# Patient Record
Sex: Male | Born: 1955 | Race: White | Hispanic: No | Smoking: Current every day smoker
Health system: Southern US, Community
[De-identification: ages and names within clinical notes are randomized; demographics above are authoritative.]

## PROBLEM LIST (undated history)

## (undated) ENCOUNTER — Emergency Department (HOSPITAL_COMMUNITY): Admission: EM | Payer: Medicaid Other | Source: Home / Self Care

## (undated) DIAGNOSIS — M549 Dorsalgia, unspecified: Secondary | ICD-10-CM

## (undated) DIAGNOSIS — R911 Solitary pulmonary nodule: Secondary | ICD-10-CM

## (undated) DIAGNOSIS — J449 Chronic obstructive pulmonary disease, unspecified: Secondary | ICD-10-CM

## (undated) DIAGNOSIS — G8929 Other chronic pain: Secondary | ICD-10-CM

## (undated) HISTORY — PX: APPENDECTOMY: SHX54

## (undated) HISTORY — PX: CERVICAL SPINE SURGERY: SHX589

---

## 1998-12-22 ENCOUNTER — Emergency Department (HOSPITAL_COMMUNITY): Admission: EM | Admit: 1998-12-22 | Discharge: 1998-12-22 | Payer: Self-pay | Admitting: Emergency Medicine

## 1999-03-05 ENCOUNTER — Ambulatory Visit (HOSPITAL_COMMUNITY): Admission: RE | Admit: 1999-03-05 | Discharge: 1999-03-05 | Payer: Self-pay | Admitting: *Deleted

## 1999-05-07 ENCOUNTER — Encounter: Payer: Self-pay | Admitting: Specialist

## 1999-05-07 ENCOUNTER — Ambulatory Visit (HOSPITAL_COMMUNITY): Admission: RE | Admit: 1999-05-07 | Discharge: 1999-05-07 | Payer: Self-pay | Admitting: Specialist

## 2000-01-27 ENCOUNTER — Emergency Department (HOSPITAL_COMMUNITY): Admission: EM | Admit: 2000-01-27 | Discharge: 2000-01-27 | Payer: Self-pay | Admitting: Emergency Medicine

## 2000-01-27 ENCOUNTER — Encounter: Payer: Self-pay | Admitting: Emergency Medicine

## 2000-12-24 ENCOUNTER — Emergency Department (HOSPITAL_COMMUNITY): Admission: EM | Admit: 2000-12-24 | Discharge: 2000-12-24 | Payer: Self-pay | Admitting: *Deleted

## 2001-02-26 ENCOUNTER — Emergency Department (HOSPITAL_COMMUNITY): Admission: EM | Admit: 2001-02-26 | Discharge: 2001-02-27 | Payer: Self-pay | Admitting: Emergency Medicine

## 2001-05-22 ENCOUNTER — Encounter: Payer: Self-pay | Admitting: Internal Medicine

## 2001-05-22 ENCOUNTER — Ambulatory Visit (HOSPITAL_COMMUNITY): Admission: RE | Admit: 2001-05-22 | Discharge: 2001-05-22 | Payer: Self-pay | Admitting: Internal Medicine

## 2001-05-23 ENCOUNTER — Emergency Department (HOSPITAL_COMMUNITY): Admission: EM | Admit: 2001-05-23 | Discharge: 2001-05-23 | Payer: Self-pay | Admitting: Emergency Medicine

## 2001-06-01 ENCOUNTER — Encounter: Payer: Self-pay | Admitting: Internal Medicine

## 2001-06-01 ENCOUNTER — Ambulatory Visit (HOSPITAL_COMMUNITY): Admission: RE | Admit: 2001-06-01 | Discharge: 2001-06-01 | Payer: Self-pay | Admitting: Internal Medicine

## 2001-09-03 ENCOUNTER — Encounter: Payer: Self-pay | Admitting: Neurosurgery

## 2001-09-05 ENCOUNTER — Ambulatory Visit (HOSPITAL_COMMUNITY): Admission: RE | Admit: 2001-09-05 | Discharge: 2001-09-06 | Payer: Self-pay | Admitting: Neurosurgery

## 2001-09-05 ENCOUNTER — Encounter: Payer: Self-pay | Admitting: Neurosurgery

## 2001-09-07 ENCOUNTER — Emergency Department (HOSPITAL_COMMUNITY): Admission: EM | Admit: 2001-09-07 | Discharge: 2001-09-07 | Payer: Self-pay | Admitting: Emergency Medicine

## 2001-09-07 ENCOUNTER — Encounter: Payer: Self-pay | Admitting: Emergency Medicine

## 2002-12-09 ENCOUNTER — Emergency Department (HOSPITAL_COMMUNITY): Admission: EM | Admit: 2002-12-09 | Discharge: 2002-12-09 | Payer: Self-pay | Admitting: Emergency Medicine

## 2002-12-10 ENCOUNTER — Encounter: Payer: Self-pay | Admitting: Emergency Medicine

## 2003-05-27 ENCOUNTER — Emergency Department (HOSPITAL_COMMUNITY): Admission: EM | Admit: 2003-05-27 | Discharge: 2003-05-27 | Payer: Self-pay | Admitting: Emergency Medicine

## 2003-06-03 ENCOUNTER — Emergency Department (HOSPITAL_COMMUNITY): Admission: EM | Admit: 2003-06-03 | Discharge: 2003-06-03 | Payer: Self-pay | Admitting: Emergency Medicine

## 2003-06-09 ENCOUNTER — Emergency Department (HOSPITAL_COMMUNITY): Admission: EM | Admit: 2003-06-09 | Discharge: 2003-06-10 | Payer: Self-pay

## 2003-06-19 ENCOUNTER — Emergency Department (HOSPITAL_COMMUNITY): Admission: EM | Admit: 2003-06-19 | Discharge: 2003-06-19 | Payer: Self-pay | Admitting: Emergency Medicine

## 2003-06-27 ENCOUNTER — Emergency Department (HOSPITAL_COMMUNITY): Admission: AD | Admit: 2003-06-27 | Discharge: 2003-06-27 | Payer: Self-pay | Admitting: Family Medicine

## 2003-06-30 ENCOUNTER — Encounter: Admission: RE | Admit: 2003-06-30 | Discharge: 2003-06-30 | Payer: Self-pay | Admitting: Internal Medicine

## 2003-07-03 ENCOUNTER — Encounter: Admission: RE | Admit: 2003-07-03 | Discharge: 2003-07-03 | Payer: Self-pay | Admitting: Internal Medicine

## 2003-07-29 ENCOUNTER — Encounter: Admission: RE | Admit: 2003-07-29 | Discharge: 2003-07-29 | Payer: Self-pay | Admitting: Internal Medicine

## 2003-07-31 ENCOUNTER — Encounter: Admission: RE | Admit: 2003-07-31 | Discharge: 2003-07-31 | Payer: Self-pay | Admitting: Internal Medicine

## 2003-08-05 ENCOUNTER — Encounter: Admission: RE | Admit: 2003-08-05 | Discharge: 2003-08-05 | Payer: Self-pay | Admitting: Internal Medicine

## 2003-08-05 ENCOUNTER — Ambulatory Visit (HOSPITAL_COMMUNITY): Admission: RE | Admit: 2003-08-05 | Discharge: 2003-08-05 | Payer: Self-pay | Admitting: Internal Medicine

## 2003-08-11 ENCOUNTER — Encounter: Admission: RE | Admit: 2003-08-11 | Discharge: 2003-08-11 | Payer: Self-pay | Admitting: Internal Medicine

## 2003-08-21 ENCOUNTER — Inpatient Hospital Stay (HOSPITAL_COMMUNITY): Admission: AD | Admit: 2003-08-21 | Discharge: 2003-08-28 | Payer: Self-pay | Admitting: Psychiatry

## 2003-09-07 ENCOUNTER — Emergency Department (HOSPITAL_COMMUNITY): Admission: EM | Admit: 2003-09-07 | Discharge: 2003-09-07 | Payer: Self-pay | Admitting: Emergency Medicine

## 2003-09-09 ENCOUNTER — Emergency Department (HOSPITAL_COMMUNITY): Admission: EM | Admit: 2003-09-09 | Discharge: 2003-09-09 | Payer: Self-pay | Admitting: Emergency Medicine

## 2003-09-25 ENCOUNTER — Encounter: Admission: RE | Admit: 2003-09-25 | Discharge: 2003-09-25 | Payer: Self-pay | Admitting: Internal Medicine

## 2003-11-20 ENCOUNTER — Emergency Department (HOSPITAL_COMMUNITY): Admission: EM | Admit: 2003-11-20 | Discharge: 2003-11-20 | Payer: Self-pay | Admitting: Emergency Medicine

## 2003-12-09 ENCOUNTER — Emergency Department (HOSPITAL_COMMUNITY): Admission: EM | Admit: 2003-12-09 | Discharge: 2003-12-09 | Payer: Self-pay | Admitting: Emergency Medicine

## 2003-12-10 ENCOUNTER — Encounter: Admission: RE | Admit: 2003-12-10 | Discharge: 2003-12-10 | Payer: Self-pay | Admitting: Internal Medicine

## 2003-12-11 ENCOUNTER — Emergency Department (HOSPITAL_COMMUNITY): Admission: EM | Admit: 2003-12-11 | Discharge: 2003-12-11 | Payer: Self-pay | Admitting: Emergency Medicine

## 2003-12-24 ENCOUNTER — Encounter: Admission: RE | Admit: 2003-12-24 | Discharge: 2003-12-24 | Payer: Self-pay | Admitting: Internal Medicine

## 2004-01-04 ENCOUNTER — Emergency Department (HOSPITAL_COMMUNITY): Admission: EM | Admit: 2004-01-04 | Discharge: 2004-01-04 | Payer: Self-pay | Admitting: Emergency Medicine

## 2004-01-28 ENCOUNTER — Emergency Department (HOSPITAL_COMMUNITY): Admission: EM | Admit: 2004-01-28 | Discharge: 2004-01-28 | Payer: Self-pay | Admitting: Emergency Medicine

## 2004-02-11 ENCOUNTER — Ambulatory Visit: Payer: Self-pay | Admitting: Internal Medicine

## 2004-03-15 ENCOUNTER — Emergency Department (HOSPITAL_COMMUNITY): Admission: EM | Admit: 2004-03-15 | Discharge: 2004-03-15 | Payer: Self-pay | Admitting: Emergency Medicine

## 2004-04-03 ENCOUNTER — Emergency Department (HOSPITAL_COMMUNITY): Admission: EM | Admit: 2004-04-03 | Discharge: 2004-04-03 | Payer: Self-pay | Admitting: *Deleted

## 2004-06-14 ENCOUNTER — Emergency Department (HOSPITAL_COMMUNITY): Admission: EM | Admit: 2004-06-14 | Discharge: 2004-06-14 | Payer: Self-pay | Admitting: Family Medicine

## 2004-06-20 ENCOUNTER — Emergency Department (HOSPITAL_COMMUNITY): Admission: EM | Admit: 2004-06-20 | Discharge: 2004-06-20 | Payer: Self-pay | Admitting: Emergency Medicine

## 2004-08-05 ENCOUNTER — Ambulatory Visit: Payer: Self-pay | Admitting: Family Medicine

## 2004-08-19 ENCOUNTER — Ambulatory Visit: Payer: Self-pay | Admitting: Family Medicine

## 2004-09-19 ENCOUNTER — Emergency Department (HOSPITAL_COMMUNITY): Admission: EM | Admit: 2004-09-19 | Discharge: 2004-09-19 | Payer: Self-pay | Admitting: Emergency Medicine

## 2004-10-09 ENCOUNTER — Emergency Department (HOSPITAL_COMMUNITY): Admission: EM | Admit: 2004-10-09 | Discharge: 2004-10-09 | Payer: Self-pay | Admitting: Emergency Medicine

## 2004-10-23 ENCOUNTER — Emergency Department (HOSPITAL_COMMUNITY): Admission: EM | Admit: 2004-10-23 | Discharge: 2004-10-23 | Payer: Self-pay | Admitting: Emergency Medicine

## 2005-04-11 ENCOUNTER — Ambulatory Visit: Payer: Self-pay | Admitting: Family Medicine

## 2005-06-28 ENCOUNTER — Ambulatory Visit: Payer: Self-pay | Admitting: Family Medicine

## 2005-11-10 ENCOUNTER — Encounter: Admission: RE | Admit: 2005-11-10 | Discharge: 2005-11-10 | Payer: Self-pay | Admitting: Sports Medicine

## 2005-11-30 ENCOUNTER — Encounter: Admission: RE | Admit: 2005-11-30 | Discharge: 2005-11-30 | Payer: Self-pay | Admitting: Sports Medicine

## 2006-01-26 ENCOUNTER — Encounter: Admission: RE | Admit: 2006-01-26 | Discharge: 2006-01-26 | Payer: Self-pay | Admitting: Sports Medicine

## 2006-04-19 ENCOUNTER — Ambulatory Visit: Payer: Self-pay | Admitting: Pain Medicine

## 2006-06-09 ENCOUNTER — Encounter: Payer: Self-pay | Admitting: Family Medicine

## 2006-06-09 ENCOUNTER — Ambulatory Visit: Payer: Self-pay | Admitting: Family Medicine

## 2006-06-27 ENCOUNTER — Ambulatory Visit: Payer: Self-pay | Admitting: Family Medicine

## 2006-08-03 DIAGNOSIS — M545 Low back pain: Secondary | ICD-10-CM

## 2006-08-03 DIAGNOSIS — F17210 Nicotine dependence, cigarettes, uncomplicated: Secondary | ICD-10-CM | POA: Insufficient documentation

## 2006-08-03 DIAGNOSIS — F411 Generalized anxiety disorder: Secondary | ICD-10-CM | POA: Insufficient documentation

## 2006-08-03 DIAGNOSIS — J449 Chronic obstructive pulmonary disease, unspecified: Secondary | ICD-10-CM

## 2006-09-13 ENCOUNTER — Telehealth: Payer: Self-pay | Admitting: *Deleted

## 2006-09-18 ENCOUNTER — Telehealth: Payer: Self-pay | Admitting: *Deleted

## 2006-09-18 ENCOUNTER — Ambulatory Visit: Payer: Self-pay | Admitting: Sports Medicine

## 2006-09-18 DIAGNOSIS — R350 Frequency of micturition: Secondary | ICD-10-CM

## 2006-09-25 ENCOUNTER — Encounter: Payer: Self-pay | Admitting: Family Medicine

## 2006-09-27 ENCOUNTER — Encounter: Payer: Self-pay | Admitting: Family Medicine

## 2006-09-28 ENCOUNTER — Encounter: Payer: Self-pay | Admitting: Family Medicine

## 2006-10-01 ENCOUNTER — Emergency Department (HOSPITAL_COMMUNITY): Admission: EM | Admit: 2006-10-01 | Discharge: 2006-10-01 | Payer: Self-pay | Admitting: Emergency Medicine

## 2006-10-09 ENCOUNTER — Ambulatory Visit: Payer: Self-pay | Admitting: Sports Medicine

## 2006-10-23 ENCOUNTER — Telehealth: Payer: Self-pay | Admitting: *Deleted

## 2006-10-30 ENCOUNTER — Emergency Department (HOSPITAL_COMMUNITY): Admission: EM | Admit: 2006-10-30 | Discharge: 2006-10-30 | Payer: Self-pay | Admitting: *Deleted

## 2006-11-01 ENCOUNTER — Ambulatory Visit: Payer: Self-pay | Admitting: Sports Medicine

## 2006-11-01 DIAGNOSIS — M549 Dorsalgia, unspecified: Secondary | ICD-10-CM | POA: Insufficient documentation

## 2006-11-29 ENCOUNTER — Emergency Department (HOSPITAL_COMMUNITY): Admission: EM | Admit: 2006-11-29 | Discharge: 2006-11-29 | Payer: Self-pay | Admitting: Emergency Medicine

## 2006-12-01 ENCOUNTER — Encounter: Payer: Self-pay | Admitting: *Deleted

## 2006-12-20 ENCOUNTER — Encounter (INDEPENDENT_AMBULATORY_CARE_PROVIDER_SITE_OTHER): Payer: Self-pay | Admitting: Family Medicine

## 2007-08-02 ENCOUNTER — Encounter: Admission: RE | Admit: 2007-08-02 | Discharge: 2007-08-02 | Payer: Self-pay | Admitting: Sports Medicine

## 2008-02-07 ENCOUNTER — Emergency Department (HOSPITAL_COMMUNITY): Admission: EM | Admit: 2008-02-07 | Discharge: 2008-02-07 | Payer: Self-pay | Admitting: Emergency Medicine

## 2008-07-29 ENCOUNTER — Encounter: Admission: RE | Admit: 2008-07-29 | Discharge: 2008-07-29 | Payer: Self-pay | Admitting: Sports Medicine

## 2008-09-10 ENCOUNTER — Emergency Department (HOSPITAL_COMMUNITY): Admission: EM | Admit: 2008-09-10 | Discharge: 2008-09-10 | Payer: Self-pay | Admitting: Emergency Medicine

## 2009-02-20 ENCOUNTER — Encounter: Admission: RE | Admit: 2009-02-20 | Discharge: 2009-02-20 | Payer: Self-pay | Admitting: Orthopaedic Surgery

## 2009-03-04 ENCOUNTER — Ambulatory Visit (HOSPITAL_COMMUNITY): Admission: RE | Admit: 2009-03-04 | Discharge: 2009-03-05 | Payer: Self-pay | Admitting: Otolaryngology

## 2009-03-27 ENCOUNTER — Encounter (INDEPENDENT_AMBULATORY_CARE_PROVIDER_SITE_OTHER): Payer: Self-pay | Admitting: Otolaryngology

## 2009-08-18 ENCOUNTER — Ambulatory Visit (HOSPITAL_COMMUNITY): Admission: RE | Admit: 2009-08-18 | Discharge: 2009-08-18 | Payer: Self-pay | Admitting: Psychiatry

## 2009-08-18 ENCOUNTER — Emergency Department (HOSPITAL_COMMUNITY): Admission: EM | Admit: 2009-08-18 | Discharge: 2009-08-18 | Payer: Self-pay | Admitting: Emergency Medicine

## 2009-08-25 ENCOUNTER — Encounter: Admission: RE | Admit: 2009-08-25 | Discharge: 2009-08-25 | Payer: Self-pay | Admitting: Sports Medicine

## 2009-11-13 ENCOUNTER — Encounter: Admission: RE | Admit: 2009-11-13 | Discharge: 2009-11-13 | Payer: Self-pay | Admitting: Anesthesiology

## 2010-08-27 LAB — BASIC METABOLIC PANEL
BUN: 4 mg/dL — ABNORMAL LOW (ref 6–23)
CO2: 23 mEq/L (ref 19–32)
Calcium: 9.2 mg/dL (ref 8.4–10.5)
Chloride: 107 mEq/L (ref 96–112)
Creatinine, Ser: 0.86 mg/dL (ref 0.4–1.5)
GFR calc Af Amer: >60 mL/min (ref >60)
GFR calc non Af Amer: >60 mL/min (ref >60)
Glucose, Bld: 104 mg/dL — ABNORMAL HIGH (ref 70–99)
Potassium: 3.7 mEq/L (ref 3.5–5.1)
Sodium: 139 mEq/L (ref 135–145)

## 2010-08-27 LAB — CBC
HCT: 45.2 % (ref 39.0–52.0)
Hemoglobin: 15.1 g/dL (ref 13.0–17.0)
MCHC: 33.3 g/dL (ref 30.0–36.0)
MCV: 88.6 fL (ref 78.0–100.0)
Platelets: 204 10*3/uL (ref 150–400)
RBC: 5.1 MIL/uL (ref 4.22–5.81)
RDW: 12.8 % (ref 11.5–15.5)
WBC: 7.3 10*3/uL (ref 4.0–10.5)

## 2010-08-27 LAB — DIFFERENTIAL
Eosinophils Absolute: 0 10*3/uL (ref 0.0–0.7)
Eosinophils Relative: 0 % (ref 0–5)
Lymphs Abs: 1.8 10*3/uL (ref 0.7–4.0)
Monocytes Relative: 9 % (ref 3–12)

## 2010-08-27 LAB — ETHANOL: Alcohol, Ethyl (B): <5 mg/dL (ref 0–10)

## 2010-09-10 LAB — CBC
HCT: 43.8 % (ref 39.0–52.0)
MCHC: 34.7 g/dL (ref 30.0–36.0)
MCV: 87.6 fL (ref 78.0–100.0)
Platelets: 184 10*3/uL (ref 150–400)
RBC: 4.99 MIL/uL (ref 4.22–5.81)
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
## 2010-10-22 NOTE — Op Note (Signed)
[place]. [facility]  Patient:    Jonathan Smith, Jonathan Smith Visit Number: [id number] MRN: [id number]          Service Type: DSU Location: 3000 [id number] Attending Physician:  Coletta Memos Dictated by:   Mena Goes. Franky Macho, M.D. Proc. Date: 09/05/01 Admit Date:  09/05/2001                             Operative Report  PREOPERATIVE DIAGNOSIS:  Displaced disk C5-6, right C5 radiculopathy, degenerative disk disease C5-6.  POSTOPERATIVE DIAGNOSIS:  Displaced disk C5-6, right C5 radiculopathy, degenerative disk disease C5-6.  OPERATION:  Anterior cervical diskectomy C5-6, arthrodesis C5-6 with anterior instrumentation, Synthes small size plate 18 mm and four screws.  SURGEON:  Kyle L. Franky Macho, M.D.  ASSISTANT:  Payton Doughty, M.D.  ANESTHESIA:  General endotracheal.  COMPLICATIONS:  None.  INDICATIONS:  [name] is a 55 year old gentleman with a long history of left upper extremity pain due to a displaced disk at C5-6.  I recommended and he agreed to undergo operative decompressive.  DESCRIPTION OF PROCEDURE:  The patient was brought to the operating room and intubated and placed under general anesthesia without difficulty.  His head was placed in slight extension with 10 pounds of traction applied via chin strap on a horseshoe head rest.  His neck was prepped and he was draped in a sterile fashion.  The skin incision was opened with a #10 blade and taken down to the plastysma.  I opened the platsyma in a horizontal fashion.  I dissected underneath the platsyma both laterally and caudally and developed a plane between the sternocleidomastoid and the medial strap muscles.  This was on the left side.  I then identified the anterior cervical spine and placed the needle after using a Kitner to remove soft tissue.  X-rays showed that I was at C5-6.  I then removed the needle and reflected the longus coli muscles laterally.  I placed self-retaining Caspar retractor.  I  then placed two distraction pins, one at C5 and one at C6 and distracted the disk space.  The microscope was brought into position.  I opened the disk space and removed disk with curets and pituitary rongeurs.  I used the high speed drill to remove bone in the neural foramen.  I then decompressed the nerve roots bilaterally using Kerrison punches.  I opened the posterior longitudinal ligament with a microhook and used the Kerrison punch to remove the _____ repair.  After adequate decompression I then irrigated.  I then placed with the assistance of Dr. Channing Mutters an 8 mm allograft into the C5-6 space.  I removed the distraction pins and the cervical traction.  A small size 18-20 mm Synthes plate was then placed first by drilling, tapping and placing four screws, two screws in C5 and two screws in C6.  X-rays showed the plate and plug to be in good position.  I irrigated once more.  I then closed the wound in layered fashion with Dr. Rolan Bucco assistance reapproximating the platsyma.  Subcutaneous sutures were placed of Vicryl.  Skin was reapproximated.  Dermabond was used for a sterile dressing. Dictated by:   Mena Goes. Franky Macho, M.D. Attending Physician:  Coletta Memos DD:  09/06/01 TD:  09/07/01 Job: [id number]

## 2010-11-10 ENCOUNTER — Ambulatory Visit (HOSPITAL_COMMUNITY)
Admission: RE | Admit: 2010-11-10 | Discharge: 2010-11-10 | Disposition: A | Discharge status: Home / Self Care | Payer: Medicaid Other | Source: Ambulatory Visit | Attending: Internal Medicine | Admitting: Internal Medicine

## 2010-11-10 DIAGNOSIS — M79609 Pain in unspecified limb: Secondary | ICD-10-CM | POA: Insufficient documentation

## 2010-12-28 ENCOUNTER — Emergency Department (HOSPITAL_COMMUNITY)
Admission: EM | Admit: 2010-12-28 | Discharge: 2010-12-28 | Disposition: A | Discharge status: Home / Self Care | Payer: Medicaid Other | Attending: Emergency Medicine | Admitting: Emergency Medicine

## 2010-12-28 ENCOUNTER — Emergency Department (HOSPITAL_COMMUNITY): Payer: Medicaid Other

## 2010-12-28 DIAGNOSIS — S01501A Unspecified open wound of lip, initial encounter: Secondary | ICD-10-CM | POA: Insufficient documentation

## 2010-12-28 DIAGNOSIS — S060X1A Concussion with loss of consciousness of 30 minutes or less, initial encounter: Secondary | ICD-10-CM | POA: Insufficient documentation

## 2010-12-28 DIAGNOSIS — IMO0002 Reserved for concepts with insufficient information to code with codable children: Secondary | ICD-10-CM | POA: Insufficient documentation

## 2010-12-28 DIAGNOSIS — Z79899 Other long term (current) drug therapy: Secondary | ICD-10-CM | POA: Insufficient documentation

## 2011-02-25 ENCOUNTER — Emergency Department (HOSPITAL_COMMUNITY): Payer: Medicaid Other

## 2011-02-25 ENCOUNTER — Emergency Department (HOSPITAL_COMMUNITY)
Admission: EM | Admit: 2011-02-25 | Discharge: 2011-02-25 | Disposition: A | Discharge status: Home / Self Care | Payer: Medicaid Other | Attending: Emergency Medicine | Admitting: Emergency Medicine

## 2011-02-25 DIAGNOSIS — F17200 Nicotine dependence, unspecified, uncomplicated: Secondary | ICD-10-CM | POA: Insufficient documentation

## 2011-02-25 DIAGNOSIS — M25569 Pain in unspecified knee: Secondary | ICD-10-CM | POA: Insufficient documentation

## 2011-02-25 DIAGNOSIS — IMO0002 Reserved for concepts with insufficient information to code with codable children: Secondary | ICD-10-CM | POA: Insufficient documentation

## 2011-03-09 LAB — COMPREHENSIVE METABOLIC PANEL
Albumin: 4
Alkaline Phosphatase: 68
BUN: 4 — ABNORMAL LOW
Calcium: 9.1
Creatinine, Ser: 0.92
Glucose, Bld: 111 — ABNORMAL HIGH
Potassium: 3.6
Total Protein: 7.3

## 2011-03-09 LAB — DIFFERENTIAL
Lymphocytes Relative: 35
Lymphs Abs: 2.3
Monocytes Absolute: 0.6
Monocytes Relative: 9
Neutro Abs: 3.7
Neutrophils Relative %: 55

## 2011-03-09 LAB — URINALYSIS, ROUTINE W REFLEX MICROSCOPIC
Bilirubin Urine: NEGATIVE
Glucose, UA: NEGATIVE
Ketones, ur: NEGATIVE
Protein, ur: NEGATIVE

## 2011-03-09 LAB — CBC
HCT: 47.8
MCHC: 34.8
Platelets: 187
RDW: 11.7

## 2011-06-17 ENCOUNTER — Other Ambulatory Visit: Payer: Self-pay | Admitting: Sports Medicine

## 2011-06-17 DIAGNOSIS — M545 Low back pain: Secondary | ICD-10-CM

## 2011-06-23 ENCOUNTER — Ambulatory Visit
Admission: RE | Admit: 2011-06-23 | Discharge: 2011-06-23 | Disposition: A | Discharge status: Home / Self Care | Payer: Medicaid Other | Source: Ambulatory Visit | Attending: Sports Medicine | Admitting: Sports Medicine

## 2011-06-23 DIAGNOSIS — M545 Low back pain: Secondary | ICD-10-CM

## 2011-06-23 MED ORDER — IOHEXOL 180 MG/ML  SOLN
1.0000 mL | Freq: Once | INTRAMUSCULAR | Status: AC | PRN
  Administered 2011-06-23: 1 mL via INTRA_ARTICULAR

## 2011-06-23 MED ORDER — METHYLPREDNISOLONE ACETATE 40 MG/ML INJ SUSP (RADIOLOG
120.0000 mg | Freq: Once | INTRAMUSCULAR | Status: AC
  Administered 2011-06-23: 120 mg via INTRA_ARTICULAR

## 2012-09-19 ENCOUNTER — Encounter (HOSPITAL_COMMUNITY): Payer: Self-pay | Admitting: Emergency Medicine

## 2012-09-19 ENCOUNTER — Emergency Department (HOSPITAL_COMMUNITY): Payer: Medicaid Other

## 2012-09-19 ENCOUNTER — Emergency Department (HOSPITAL_COMMUNITY)
Admission: EM | Admit: 2012-09-19 | Discharge: 2012-09-19 | Disposition: A | Discharge status: Home / Self Care | Payer: Medicaid Other | Attending: Emergency Medicine | Admitting: Emergency Medicine

## 2012-09-19 DIAGNOSIS — J441 Chronic obstructive pulmonary disease with (acute) exacerbation: Secondary | ICD-10-CM | POA: Insufficient documentation

## 2012-09-19 DIAGNOSIS — Z79899 Other long term (current) drug therapy: Secondary | ICD-10-CM | POA: Insufficient documentation

## 2012-09-19 DIAGNOSIS — R911 Solitary pulmonary nodule: Secondary | ICD-10-CM | POA: Insufficient documentation

## 2012-09-19 DIAGNOSIS — R079 Chest pain, unspecified: Secondary | ICD-10-CM | POA: Insufficient documentation

## 2012-09-19 DIAGNOSIS — F17200 Nicotine dependence, unspecified, uncomplicated: Secondary | ICD-10-CM | POA: Insufficient documentation

## 2012-09-19 HISTORY — DX: Solitary pulmonary nodule: R91.1

## 2012-09-19 LAB — CBC WITH DIFFERENTIAL/PLATELET
Eosinophils Absolute: 0.1 10*3/uL (ref 0.0–0.7)
Eosinophils Relative: 2 % (ref 0–5)
HCT: 44.9 % (ref 39.0–52.0)
Hemoglobin: 16.2 g/dL (ref 13.0–17.0)
Lymphocytes Relative: 34 % (ref 12–46)
Lymphs Abs: 2.5 10*3/uL (ref 0.7–4.0)
MCH: 30.6 pg (ref 26.0–34.0)
MCV: 84.9 fL (ref 78.0–100.0)
Monocytes Relative: 11 % (ref 3–12)
Platelets: 200 10*3/uL (ref 150–400)
RBC: 5.29 MIL/uL (ref 4.22–5.81)
WBC: 7.4 10*3/uL (ref 4.0–10.5)

## 2012-09-19 LAB — COMPREHENSIVE METABOLIC PANEL
ALT: 97 U/L — ABNORMAL HIGH (ref 0–53)
Alkaline Phosphatase: 68 U/L (ref 39–117)
BUN: 12 mg/dL (ref 6–23)
CO2: 23 mEq/L (ref 19–32)
Calcium: 9.2 mg/dL (ref 8.4–10.5)
GFR calc Af Amer: >90 mL/min (ref >90)
GFR calc non Af Amer: >90 mL/min (ref >90)
Glucose, Bld: 110 mg/dL — ABNORMAL HIGH (ref 70–99)
Sodium: 136 mEq/L (ref 135–145)
Total Protein: 7.9 g/dL (ref 6.0–8.3)

## 2012-09-19 LAB — TROPONIN I: Troponin I: <0.3 ng/mL (ref <0.30)

## 2012-09-19 MED ORDER — HYDROMORPHONE HCL PF 1 MG/ML IJ SOLN
1.0000 mg | Freq: Once | INTRAMUSCULAR | Status: AC
  Administered 2012-09-19: 1 mg via INTRAVENOUS
  Filled 2012-09-19: qty 1

## 2012-09-19 MED ORDER — ALBUTEROL SULFATE HFA 108 (90 BASE) MCG/ACT IN AERS: 1.0000 | INHALATION_SPRAY | RESPIRATORY_TRACT | Status: DC | PRN

## 2012-09-19 MED ORDER — ALBUTEROL SULFATE (5 MG/ML) 0.5% IN NEBU
5.0000 mg | INHALATION_SOLUTION | Freq: Once | RESPIRATORY_TRACT | Status: AC
  Administered 2012-09-19: 5 mg via RESPIRATORY_TRACT
  Filled 2012-09-19: qty 1

## 2012-09-19 MED ORDER — PREDNISONE 50 MG PO TABS: 50.0000 mg | ORAL_TABLET | Freq: Every day | ORAL | Status: DC

## 2012-09-19 MED ORDER — IPRATROPIUM BROMIDE 0.02 % IN SOLN
0.5000 mg | Freq: Once | RESPIRATORY_TRACT | Status: AC
  Administered 2012-09-19: 0.5 mg via RESPIRATORY_TRACT
  Filled 2012-09-19: qty 2.5

## 2012-09-19 MED ORDER — PREDNISONE 20 MG PO TABS
60.0000 mg | ORAL_TABLET | Freq: Once | ORAL | Status: AC
  Administered 2012-09-19: 60 mg via ORAL
  Filled 2012-09-19: qty 3

## 2012-09-19 MED ORDER — IOHEXOL 300 MG/ML  SOLN
80.0000 mL | Freq: Once | INTRAMUSCULAR | Status: AC | PRN
  Administered 2012-09-19: 80 mL via INTRAVENOUS

## 2012-09-19 NOTE — ED Notes (Signed)
Pt states he has been having "left lung pain" for the past week.  Pt states he has also been having sob and sweating for the past week.  Pt states he has a hx of lung nodule.

## 2012-09-19 NOTE — ED Notes (Signed)
MD at bedside. Dr. Yao. 

## 2012-09-19 NOTE — ED Provider Notes (Addendum)
History     CSN: [id number]  Arrival date & time 09/19/12  6213   First MD Initiated Contact with Patient 09/19/12 [phone]      Chief Complaint  Patient presents with  . Shortness of Breath  . Chest Pain    (Consider location/radiation/quality/duration/timing/severity/associated sxs/prior treatment) The history is provided by the patient.  GURJIT LOCONTE is a 57 y.o. male here with left-sided chest pain. He has chronic chest pain for the last 5 years. He has been on pain medicine which has not helped. He was diagnosed with lung nodule several years ago but still smoking. He said over the last week he has more "L lung pain". He describes it as a constant achy pain. Also some associated shortness of breath. No fevers or chills. No cardiac history but is a smoker. Used to use drugs but now quit.    Past Medical History  Diagnosis Date  . Lung nodule     Past Surgical History  Procedure Laterality Date  . Cervical spine surgery      History reviewed. No pertinent family history.  History  Substance Use Topics  . Smoking status: Current Every Day Smoker -- 1.50 packs/day for 30 years    Types: Cigarettes  . Smokeless tobacco: Never Used  . Alcohol Use: Not on file      Review of Systems  Respiratory: Positive for shortness of breath.   Cardiovascular: Positive for chest pain.  All other systems reviewed and are negative.    Allergies  Review of patient's allergies indicates no known allergies.  Home Medications   Current Outpatient Rx  Name  Route  Sig  Dispense  Refill  . ALPRAZolam (XANAX) 1 MG tablet   Oral   Take 1 mg by mouth 3 (three) times daily as needed for sleep (anxiety).         . cyclobenzaprine (FLEXERIL) 10 MG tablet   Oral   Take 10 mg by mouth 3 (three) times daily as needed for muscle spasms.         Marland Kitchen oxymorphone (OPANA ER) 20 MG 12 hr tablet   Oral   Take 20 mg by mouth every 12 (twelve) hours.         Marland Kitchen oxymorphone (OPANA) 10 MG  tablet   Oral   Take 10 mg by mouth every 4 (four) hours as needed for pain.         . tapentadol (NUCYNTA) 50 MG TABS   Oral   Take 12.5 mg by mouth daily as needed.           BP 127/78  Pulse 85  Temp(Src) 97.5 F (36.4 C) (Oral)  Resp 25  SpO2 99%  Physical Exam  Nursing note and vitals reviewed. Constitutional: He is oriented to person, place, and time.  Anxious, smells of cigarettes   HENT:  Head: Normocephalic.  Mouth/Throat: Oropharynx is clear and moist.  Eyes: Conjunctivae are normal. Pupils are equal, round, and reactive to light.  Neck: Normal range of motion. Neck supple.  Cardiovascular: Normal rate, regular rhythm and normal heart sounds.   Pulmonary/Chest:  Slightly tachypneic, + diffuse wheezing, talking in full sentences, no retractions. Somewhat reproducible on palpation of sternum.   Abdominal: Soft. Bowel sounds are normal. He exhibits no distension. There is no tenderness. There is no rebound and no guarding.  Musculoskeletal: Normal range of motion. He exhibits no edema and no tenderness.  Neurological: He is alert and oriented to person, place,  and time.  Skin: Skin is warm and dry.  Psychiatric: He has a normal mood and affect. His behavior is normal. Judgment and thought content normal.    ED Course  Procedures (including critical care time)  Labs Reviewed  CBC WITH DIFFERENTIAL - Abnormal; Notable for the following:    MCHC 36.1 (*)    All other components within normal limits  COMPREHENSIVE METABOLIC PANEL - Abnormal; Notable for the following:    Glucose, Bld 110 (*)    AST 63 (*)    ALT 97 (*)    All other components within normal limits  TROPONIN I   Dg Chest 2 View  09/19/2012  *RADIOLOGY REPORT*  Clinical Data: Short of breath.  Left-sided chest pain.  CHEST - 2 VIEW  Comparison: 09/10/2008  Findings: Heart size is normal.  Mediastinal shadows are normal. There are chronically prominent interstitial lung markings but no sign of  acute infiltrate, mass, effusion or collapse.  Snap artifacts overlie the chest.  There has been previous thoracolumbar are decompression and fusion which appears unremarkable.  No acute bony finding.  IMPRESSION: Chronically prominent interstitial lung markings.  No active process evident.   Original Report Authenticated By: Paulina Fusi, M.D.    Ct Chest W Contrast  09/19/2012  *RADIOLOGY REPORT*  Clinical Data: Short of breath.  Lung nodule.  Chronic left-sided chest pain.  Nodule seen on chest radiograph.  CT CHEST WITH CONTRAST  Technique:  Multidetector CT imaging of the chest was performed following the standard protocol during bolus administration of intravenous contrast.  Contrast: 80mL OMNIPAQUE IOHEXOL 300 MG/ML  SOLN  Comparison: Chest radiograph 09/19/2012.  Findings: Normal three-vessel aortic arch.  There is no axillary adenopathy.  No mediastinal or hilar adenopathy.  No pericardial or pleural effusion.  There are no aggressive osseous lesions. The lungs show no evidence of airspace disease.  There is a ground-glass attenuation nodule in the left lower lobe measuring 8 mm.  Nodule is present on image number 42 series 7. Initial follow-up by chest CT without contrast is recommended in 3 months to confirm persistence.   This recommendation follows the consensus statement: Recommendations for the Management of Subsolid Pulmonary Nodules Detected at CT:  A Statement from the Fleischner Society as published in Radiology 2013; 266:304-317. Thoracic spinal fixation hardware noted.  IMPRESSION: 1.  No acute cardiopulmonary disease. 2.  Left lower lobe basilar ground-glass attenuation pulmonary nodule measuring 8 mm. Initial follow-up by chest CT without contrast is recommended in 3 months to confirm persistence.   This recommendation follows the consensus statement: Recommendations for the Management of Subsolid Pulmonary Nodules Detected at CT:  A Statement from the Fleischner Society as published in  Radiology 2013; 266:304-317.   Original Report Authenticated By: Andreas Newport, M.D.      No diagnosis found.   Date: 09/19/2012  Rate: 78  Rhythm: normal sinus rhythm  QRS Axis: normal  Intervals: normal  ST/T Wave abnormalities: nonspecific ST changes  Conduction Disutrbances:left anterior fascicular block  Narrative Interpretation:   Old EKG Reviewed: unchanged     MDM  NARCISO STOUTENBURG is a 57 y.o. male here with gradually worsening L sided chest pain. Pain is somewhat reproducible and has been going on for a week. I think its MSK related to coughing from bronchitis. Unlikely to be ACS so trop x 1 sufficient. Will get labs, CXR, will give nebs and pain meds and reassess. I recommend smoking cessation.   10:56 AM Felt better after  nebs and steroids. CXR showed chronic changes, CT chest showed 8 mm nodule. I told him to f/u outpatient. Will give nebs and steroids. Recommend smoking cessation. Pain improved with dilaudid and he has f/u and pain meds at home. I told him to get refills only with his doctor.         Richardean Canal, MD 09/19/12 1057  Richardean Canal, MD 09/19/12 1101

## 2012-09-24 ENCOUNTER — Encounter: Payer: Self-pay | Admitting: *Deleted

## 2012-09-28 ENCOUNTER — Ambulatory Visit (INDEPENDENT_AMBULATORY_CARE_PROVIDER_SITE_OTHER): Payer: Medicaid Other | Admitting: Internal Medicine

## 2012-09-28 ENCOUNTER — Encounter: Payer: Self-pay | Admitting: Internal Medicine

## 2012-09-28 VITALS — BP 118/80 | HR 89 | Temp 96.2°F | Ht 68.5 in | Wt 176.0 lb

## 2012-09-28 DIAGNOSIS — F17200 Nicotine dependence, unspecified, uncomplicated: Secondary | ICD-10-CM

## 2012-09-28 DIAGNOSIS — R079 Chest pain, unspecified: Secondary | ICD-10-CM

## 2012-09-28 DIAGNOSIS — R911 Solitary pulmonary nodule: Secondary | ICD-10-CM

## 2012-09-28 NOTE — Patient Instructions (Addendum)
Classic subdiaphragmatic pain pattern suggests ibs:  Stereotypical, migratory with a very limited distribution of pain locations, daytime, not exacerbated by ex or coughing, worse in sitting position, associated with generalized abd bloating,  not present supine due to the dome effect of the diaphragm is  canceled in that position. Frequently these patients have had multiple negative GI workups and CT scans.  Treatment consists of avoiding foods that cause gas (especially beans and raw vegetables like spinach and salads and boiled eggs)  and citrucel 1 heaping tbsp twice daily with a large glass of water.  Pain should improve w/in 2 weeks and if not then consider further GI work up - call us if you need referral.   mylanta II,  Gas x or Maalox plus are good to use as needed  The key is to stop smoking completely before smoking completely stops you - I don't think it's too late  All that I recommend is a CT Chest in 6 month placed in our files

## 2012-09-28 NOTE — Progress Notes (Signed)
  Subjective:    Patient ID: Jonathan Smith, male    DOB: 07/28/1955   MRN: [id number]  HPI  26 yowm smoker with chronic back pain since 1993 with "lung pain" since 2008 comes and goes and assoc with difficulty breathing started around 2012 gradually worse daily referred by edp for SPN.   09/28/2012 1st pulmonary ov cc L Chest wall below nipple worse x 6 months on 02 at hs by [place]. Walk x 50 ft and can't walk block s back pain limiting him. No obvious daytime variabilty or assoc chronic cough or cp or chest tightness, subjective wheeze overt sinus or hb symptoms. No unusual exp hx or h/o childhood pna/ asthma or premature birth to his knowledge.   CP comes in waves, always in sitting position, some relieved with belching, not really pleuritic quality, some times radiates to L flank, dissipates in supine position.  Sleeping ok without nocturnal  or early am exacerbation  of respiratory  c/o's or need for noct saba. Also denies any obvious fluctuation of symptoms with weather or environmental changes or other aggravating or alleviating factors except as outlined above   Review of Systems  Constitutional: Negative for fever, chills, activity change, appetite change and unexpected weight change.  HENT: Positive for trouble swallowing. Negative for congestion, sore throat, rhinorrhea, sneezing, dental problem, voice change and postnasal drip.   Eyes: Negative for visual disturbance.  Respiratory: Positive for shortness of breath. Negative for cough and choking.   Cardiovascular: Negative for chest pain and leg swelling.  Gastrointestinal: Negative for nausea, vomiting and abdominal pain.  Genitourinary: Negative for difficulty urinating.  Musculoskeletal: Positive for arthralgias.  Skin: Negative for rash.  Psychiatric/Behavioral: Negative for behavioral problems and confusion.       Objective:   Physical Exam   Extremely anxious, restless wm  Patient failed to answer a single question  asked in a straightforward manner, tending to go off on tangents or answer questions with ambiguous medical terms or diagnoses and seemed aggravated  when asked the same question more than once for clarification.   Wt Readings from Last 3 Encounters:  09/28/12 176 lb (79.833 kg)  11/01/06 186 lb (84.369 kg)  10/09/06 182 lb (82.555 kg)    HEENT: nl dentition, turbinates, and orophanx. Nl external ear canals without cough reflex   NECK :  without JVD/Nodes/TM/ nl carotid upstrokes bilaterally   LUNGS: no acc muscle use, clear to A and P bilaterally without cough on insp or exp maneuvers   CV:  RRR  no s3 or murmur or increase in P2, no edema   ABD:  soft and nontender with nl excursion in the supine position. No bruits or organomegaly, bowel sounds nl  MS:  warm without deformities, calf tenderness, cyanosis or clubbing  SKIN: warm and dry without lesions    NEURO:  alert, approp, no deficits   CT chest 09/19/13 1. No acute cardiopulmonary disease.  2. Left lower lobe basilar ground-glass attenuation pulmonary  nodule measuring 8 mm. Initial follow-up by chest CT without  contrast is recommended in 3 months to confirm persistence No effusion.  Pulmonary review:  No effusion and the gg density is not at the pleural surface located posterior laterally  cxr 09/19/12 with a very large stomach bubble    Assessment & Plan:

## 2012-09-30 DIAGNOSIS — R911 Solitary pulmonary nodule: Secondary | ICD-10-CM | POA: Insufficient documentation

## 2012-09-30 DIAGNOSIS — R079 Chest pain, unspecified: Secondary | ICD-10-CM | POA: Insufficient documentation

## 2012-09-30 NOTE — Assessment & Plan Note (Signed)
Although there are clearly abnormalities on CT scan, they should probably be considered "microscopic" since not obvious on plain cxr .     In the setting of obvious "macroscopic" health issues,  I am very reluctatnt to embark on an invasive w/u at this point but will arrange consevative  follow up and in the meantime see what we can do to address the patient's subjective concerns.    Placed in tickle file for recall in 6 months

## 2012-09-30 NOTE — Assessment & Plan Note (Signed)
>  3 min  I reviewed the Flethcher curve with patient that basically indicates  if you quit smoking when your best day FEV1 is still well preserved (which his appears to be) it is highly unlikely you will progress to severe disease and informed the patient there was no medication on the market that has proven to change the curve or the likelihood of progression.  Therefore stopping smoking and maintaining abstinence is the most important aspect of care, not choice of inhalers or for that matter, doctors.   

## 2012-09-30 NOTE — Assessment & Plan Note (Signed)
Pain is chronic, always sitting, coming and going x 6 years and assoc with lots of abd gas both by hx and by plain cxr and has likely nothing to do with the spn he was referred for.  rec citrucel and gas elimination diet then regroup.

## 2013-03-05 ENCOUNTER — Telehealth: Payer: Self-pay | Admitting: *Deleted

## 2013-03-05 NOTE — Telephone Encounter (Signed)
LMTCB to inform pt and will then place order to [facility]

## 2013-03-05 NOTE — Telephone Encounter (Signed)
Message copied by Christen Butter on Tue Mar 05, 2013  4:45 PM ------      Message from: Sandrea Hughs B      Created: Fri Sep 28, 2012  3:48 PM       Make sure he has CT Chest by now limited to LLL nodule ------

## 2013-03-13 NOTE — Telephone Encounter (Signed)
LMTCB

## 2013-03-21 ENCOUNTER — Telehealth: Payer: Self-pay | Admitting: Internal Medicine

## 2013-03-21 ENCOUNTER — Encounter: Payer: Self-pay | Admitting: Internal Medicine

## 2013-03-21 NOTE — Telephone Encounter (Signed)
Letter mailed to the pt. 

## 2013-03-21 NOTE — Telephone Encounter (Signed)
error 

## 2013-04-08 ENCOUNTER — Telehealth: Payer: Self-pay | Admitting: Internal Medicine

## 2013-04-08 NOTE — Telephone Encounter (Signed)
Called Patient to set up follow up apt, Left message x3. No return call back. Sent letter 04/08/13  ° °

## 2013-05-17 ENCOUNTER — Emergency Department (HOSPITAL_COMMUNITY): Payer: Medicaid Other

## 2013-05-17 ENCOUNTER — Encounter (HOSPITAL_COMMUNITY): Payer: Self-pay | Admitting: Emergency Medicine

## 2013-05-17 ENCOUNTER — Emergency Department (EMERGENCY_DEPARTMENT_HOSPITAL)
Admission: EM | Admit: 2013-05-17 | Discharge: 2013-05-18 | Disposition: A | Discharge status: Other Acute Inpatient Hospital | Payer: Medicaid Other | Source: Home / Self Care | Attending: Emergency Medicine | Admitting: Emergency Medicine

## 2013-05-17 DIAGNOSIS — F3110 Bipolar disorder, current episode manic without psychotic features, unspecified: Secondary | ICD-10-CM

## 2013-05-17 DIAGNOSIS — F319 Bipolar disorder, unspecified: Secondary | ICD-10-CM

## 2013-05-17 DIAGNOSIS — R443 Hallucinations, unspecified: Secondary | ICD-10-CM

## 2013-05-17 DIAGNOSIS — R079 Chest pain, unspecified: Secondary | ICD-10-CM

## 2013-05-17 HISTORY — DX: Chronic obstructive pulmonary disease, unspecified: J44.9

## 2013-05-17 HISTORY — DX: Other chronic pain: G89.29

## 2013-05-17 HISTORY — DX: Dorsalgia, unspecified: M54.9

## 2013-05-17 LAB — CBC WITH DIFFERENTIAL/PLATELET
Basophils Absolute: 0 10*3/uL (ref 0.0–0.1)
Eosinophils Absolute: 0 10*3/uL (ref 0.0–0.7)
Eosinophils Relative: 0 % (ref 0–5)
Lymphocytes Relative: 21 % (ref 12–46)
MCV: 82.2 fL (ref 78.0–100.0)
Platelets: 164 10*3/uL (ref 150–400)
RBC: 5.78 MIL/uL (ref 4.22–5.81)
RDW: 11.8 % (ref 11.5–15.5)

## 2013-05-17 LAB — COMPREHENSIVE METABOLIC PANEL
AST: 64 U/L — ABNORMAL HIGH (ref 0–37)
Albumin: 4.4 g/dL (ref 3.5–5.2)
Calcium: 9.8 mg/dL (ref 8.4–10.5)
Chloride: 96 mEq/L (ref 96–112)
Creatinine, Ser: 0.88 mg/dL (ref 0.50–1.35)
Total Protein: 8.7 g/dL — ABNORMAL HIGH (ref 6.0–8.3)

## 2013-05-17 LAB — RAPID URINE DRUG SCREEN, HOSP PERFORMED
Benzodiazepines: NOT DETECTED
Cocaine: NOT DETECTED
Opiates: NOT DETECTED

## 2013-05-17 LAB — SALICYLATE LEVEL: Salicylate Lvl: <2 mg/dL — ABNORMAL LOW (ref 2.8–20.0)

## 2013-05-17 LAB — ETHANOL: Alcohol, Ethyl (B): <11 mg/dL (ref 0–11)

## 2013-05-17 MED ORDER — ACETAMINOPHEN 325 MG PO TABS: 650.0000 mg | ORAL_TABLET | ORAL | Status: DC | PRN

## 2013-05-17 MED ORDER — POTASSIUM CHLORIDE CRYS ER 20 MEQ PO TBCR
40.0000 meq | EXTENDED_RELEASE_TABLET | Freq: Once | ORAL | Status: AC
  Administered 2013-05-17: 40 meq via ORAL
  Filled 2013-05-17: qty 2

## 2013-05-17 MED ORDER — MORPHINE SULFATE ER 100 MG PO TBCR
100.0000 mg | EXTENDED_RELEASE_TABLET | Freq: Two times a day (BID) | ORAL | Status: DC
  Administered 2013-05-17 – 2013-05-18 (×2): 100 mg via ORAL
  Filled 2013-05-17 (×2): qty 1

## 2013-05-17 MED ORDER — IBUPROFEN 200 MG PO TABS: 600.0000 mg | ORAL_TABLET | Freq: Three times a day (TID) | ORAL | Status: DC | PRN

## 2013-05-17 MED ORDER — ALBUTEROL SULFATE HFA 108 (90 BASE) MCG/ACT IN AERS: 1.0000 | INHALATION_SPRAY | RESPIRATORY_TRACT | Status: DC | PRN

## 2013-05-17 MED ORDER — LORAZEPAM 1 MG PO TABS
1.0000 mg | ORAL_TABLET | Freq: Three times a day (TID) | ORAL | Status: DC | PRN
  Administered 2013-05-18 (×2): 1 mg via ORAL
  Filled 2013-05-17 (×2): qty 1

## 2013-05-17 MED ORDER — MORPHINE SULFATE 15 MG PO TABS: 30.0000 mg | ORAL_TABLET | ORAL | Status: DC | PRN

## 2013-05-17 MED ORDER — LORAZEPAM 2 MG/ML IJ SOLN
1.0000 mg | Freq: Once | INTRAMUSCULAR | Status: AC
  Administered 2013-05-17: 1 mg via INTRAMUSCULAR
  Filled 2013-05-17: qty 1

## 2013-05-17 MED ORDER — HYDROMORPHONE HCL PF 1 MG/ML IJ SOLN
1.0000 mg | Freq: Once | INTRAMUSCULAR | Status: AC
  Administered 2013-05-17: 1 mg via INTRAMUSCULAR
  Filled 2013-05-17: qty 1

## 2013-05-17 NOTE — ED Notes (Signed)
Pt BIB EMS. Pt told EMS that he has epigastric pain. Pt also expressing paranoid thoughts per EMS. ETOH on breath. Pt denies SI.
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
## 2013-05-17 NOTE — ED Notes (Signed)
Pt unable to go to Psych ED because he is supposed to be on 2L O2 via [place] at home.

## 2013-05-17 NOTE — ED Notes (Signed)
Bed: [facility] Expected date:  Expected time:  Means of arrival:  Comments: EMS-ETOH-heartburn

## 2013-05-17 NOTE — ED Provider Notes (Addendum)
CSN: [id number]     Arrival date & time 05/17/13  1922 History   First MD Initiated Contact with Patient 05/17/13 1950     Chief Complaint  Patient presents with  . Shortness of Breath   (Consider location/radiation/quality/duration/timing/severity/associated sxs/prior Treatment) The history is provided by the patient.  Jonathan Smith is a 57 y.o. male history of lung nodule, COPD, chronic back pain, anxiety here presenting with shortness of breath and hallucinations. He has chronic shortness of breath that got worse today. His girlfriend was not home to give him his pain medicine so he was more anxious and worried. Moreover he states that he has not been sleeping well last several days. He starting having some hallucinations. He states that people are stealing from his bank account. He also states that people are talking about him on face but it is very stressed out about it. Denies any suicidal or homicidal ideations.    Past Medical History  Diagnosis Date  . Lung nodule   . COPD (chronic obstructive pulmonary disease)   . Chronic back pain    Past Surgical History  Procedure Laterality Date  . Cervical spine surgery    . Appendectomy     Family History  Problem Relation Age of Onset  . Emphysema Paternal Grandfather     smoked   History  Substance Use Topics  . Smoking status: Current Every Day Smoker -- 1.00 packs/day for 30 years    Types: Cigarettes  . Smokeless tobacco: Never Used  . Alcohol Use: Yes     Comment: occasional    Review of Systems  Respiratory: Positive for shortness of breath.   Psychiatric/Behavioral: Positive for hallucinations and sleep disturbance.  All other systems reviewed and are negative.    Allergies  Review of patient's allergies indicates no known allergies.  Home Medications   Current Outpatient Rx  Name  Route  Sig  Dispense  Refill  . albuterol (PROVENTIL HFA;VENTOLIN HFA) 108 (90 BASE) MCG/ACT inhaler   Inhalation   Inhale  1-2 puffs into the lungs every 4 (four) hours as needed for wheezing.   1 Inhaler   0   . ALPRAZolam (XANAX) 1 MG tablet   Oral   Take 1 mg by mouth 3 (three) times daily as needed for sleep (anxiety).         Marland Kitchen oxymorphone (OPANA ER) 40 MG 12 hr tablet   Oral   Take 40 mg by mouth every 12 (twelve) hours as needed for pain.         Marland Kitchen oxymorphone (OPANA) 10 MG tablet   Oral   Take 10 mg by mouth every 4 (four) hours as needed for pain.          BP 119/83  Pulse 104  Temp(Src) 99.4 F (37.4 C) (Oral)  Resp 16  Ht 5\' 9"  (1.753 m)  Wt 165 lb (74.844 kg)  BMI 24.36 kg/m2  SpO2 98% Physical Exam  Nursing note and vitals reviewed. Constitutional: He is oriented to person, place, and time.  Hallucinating, paranoid, anxious   HENT:  Head: Normocephalic.  Mouth/Throat: Oropharynx is clear and moist.  Eyes: Conjunctivae are normal. Pupils are equal, round, and reactive to light.  Neck: Normal range of motion. Neck supple.  Cardiovascular: Regular rhythm and normal heart sounds.   Borderline tachy  Pulmonary/Chest: Effort normal and breath sounds normal. No respiratory distress. He has no wheezes. He has no rales.  Abdominal: Soft. Bowel sounds are normal.  He exhibits no distension. There is no tenderness. There is no guarding.  Musculoskeletal: Normal range of motion.  Neurological: He is alert and oriented to person, place, and time.  Skin: Skin is warm and dry.  Psychiatric:  Paranoid. Hallucinating     ED Course  Procedures (including critical care time) Labs Review Labs Reviewed  COMPREHENSIVE METABOLIC PANEL - Abnormal; Notable for the following:    Potassium 3.3 (*)    Glucose, Bld 107 (*)    Total Protein 8.7 (*)    AST 64 (*)    ALT 104 (*)    All other components within normal limits  SALICYLATE LEVEL - Abnormal; Notable for the following:    Salicylate Lvl <2.0 (*)    All other components within normal limits  ACETAMINOPHEN LEVEL  ETHANOL  CBC WITH  DIFFERENTIAL  URINE RAPID DRUG SCREEN (HOSP PERFORMED)  POCT I-STAT TROPONIN I   Imaging Review Dg Chest 2 View  05/17/2013   CLINICAL DATA:  Shortness of breath, left rib pain  EXAM: CHEST  2 VIEW  COMPARISON:  CT chest dated 09/19/2012  FINDINGS: Increased interstitial markings/bronchitic changes. No focal consolidation. No pleural effusion or pneumothorax.  The heart is normal in size.  Cervical spine and lower thoracic spine fixation hardware.  IMPRESSION: No evidence of acute cardiopulmonary disease.   Electronically Signed   By: Charline Bills M.D.   On: 05/17/2013 20:51    EKG Interpretation    Date/Time:  Friday May 17 2013 21:04:31 EST Ventricular Rate:  87 PR Interval:  130 QRS Duration: 107 QT Interval:  374 QTC Calculation: 450 R Axis:   -110 Text Interpretation:  Sinus rhythm LAD, consider left anterior fascicular block Abnormal R-wave progression, late transition Baseline wander in lead(s) III No significant change since last tracing Confirmed by YAO  MD, DAVID [phone]) on 05/17/2013 9:06:31 PM            MDM  No diagnosis found. Jonathan Smith is a 57 y.o. male here with SOB, hallucinations. SOB chronic. No evidence of COPD exacerbation, likely worsening of his chronic pain. I am concerned about his paranoid delusions. Will have him evaluated by psych.   11:08 PM Psych saw patient. Recommend inpatient admission. Patient doesn't want to stay, will fill out IVC. Medically cleared for psych eval.     Richardean Canal, MD 05/17/13 2138  Richardean Canal, MD 05/17/13 [phone]

## 2013-05-17 NOTE — ED Notes (Signed)
EKG given to EDP, Pollina, MD for review.

## 2013-05-17 NOTE — ED Notes (Signed)
Pt. Refused to get into blue scrubs. Pt. States "he doesn;t want to go to the psych-ed." EDP, Yao,MD. Made aware.

## 2013-05-18 ENCOUNTER — Inpatient Hospital Stay (HOSPITAL_COMMUNITY)
Admission: AD | Admit: 2013-05-18 | Discharge: 2013-05-21 | DRG: 885 | Disposition: A | Discharge status: Home / Self Care | Payer: Medicaid Other | Attending: Psychiatry | Admitting: Psychiatry

## 2013-05-18 ENCOUNTER — Encounter (HOSPITAL_COMMUNITY): Payer: Self-pay | Admitting: *Deleted

## 2013-05-18 DIAGNOSIS — M545 Low back pain, unspecified: Secondary | ICD-10-CM

## 2013-05-18 DIAGNOSIS — Z79899 Other long term (current) drug therapy: Secondary | ICD-10-CM

## 2013-05-18 DIAGNOSIS — M549 Dorsalgia, unspecified: Secondary | ICD-10-CM

## 2013-05-18 DIAGNOSIS — F4310 Post-traumatic stress disorder, unspecified: Secondary | ICD-10-CM

## 2013-05-18 DIAGNOSIS — R079 Chest pain, unspecified: Secondary | ICD-10-CM

## 2013-05-18 DIAGNOSIS — F319 Bipolar disorder, unspecified: Secondary | ICD-10-CM | POA: Diagnosis present

## 2013-05-18 DIAGNOSIS — R911 Solitary pulmonary nodule: Secondary | ICD-10-CM

## 2013-05-18 DIAGNOSIS — J4489 Other specified chronic obstructive pulmonary disease: Secondary | ICD-10-CM

## 2013-05-18 DIAGNOSIS — F411 Generalized anxiety disorder: Secondary | ICD-10-CM

## 2013-05-18 DIAGNOSIS — F3110 Bipolar disorder, current episode manic without psychotic features, unspecified: Principal | ICD-10-CM

## 2013-05-18 DIAGNOSIS — R350 Frequency of micturition: Secondary | ICD-10-CM

## 2013-05-18 DIAGNOSIS — F17200 Nicotine dependence, unspecified, uncomplicated: Secondary | ICD-10-CM

## 2013-05-18 DIAGNOSIS — G8929 Other chronic pain: Secondary | ICD-10-CM | POA: Diagnosis present

## 2013-05-18 DIAGNOSIS — J449 Chronic obstructive pulmonary disease, unspecified: Secondary | ICD-10-CM

## 2013-05-18 DIAGNOSIS — R443 Hallucinations, unspecified: Secondary | ICD-10-CM

## 2013-05-18 MED ORDER — MORPHINE SULFATE ER 100 MG PO TBCR
100.0000 mg | EXTENDED_RELEASE_TABLET | Freq: Two times a day (BID) | ORAL | Status: DC
  Filled 2013-05-18: qty 1

## 2013-05-18 MED ORDER — RISPERIDONE 1 MG PO TABS
1.0000 mg | ORAL_TABLET | Freq: Every day | ORAL | Status: DC
  Filled 2013-05-18 (×3): qty 1

## 2013-05-18 MED ORDER — MORPHINE SULFATE ER 15 MG PO TBCR
90.0000 mg | EXTENDED_RELEASE_TABLET | Freq: Two times a day (BID) | ORAL | Status: DC
  Administered 2013-05-18 – 2013-05-20 (×4): 90 mg via ORAL
  Filled 2013-05-18 (×4): qty 6

## 2013-05-18 MED ORDER — ALUM & MAG HYDROXIDE-SIMETH 200-200-20 MG/5ML PO SUSP: 30.0000 mL | ORAL | Status: DC | PRN

## 2013-05-18 MED ORDER — RISPERIDONE 1 MG PO TABS: 1.0000 mg | ORAL_TABLET | Freq: Every day | ORAL | Status: DC

## 2013-05-18 MED ORDER — MAGNESIUM HYDROXIDE 400 MG/5ML PO SUSP
30.0000 mL | Freq: Every day | ORAL | Status: DC | PRN
  Administered 2013-05-18 – 2013-05-19 (×2): 30 mL via ORAL

## 2013-05-18 MED ORDER — ACETAMINOPHEN 325 MG PO TABS
650.0000 mg | ORAL_TABLET | Freq: Four times a day (QID) | ORAL | Status: DC | PRN
  Administered 2013-05-19: 650 mg via ORAL
  Filled 2013-05-18: qty 2

## 2013-05-18 MED ORDER — TRAZODONE HCL 50 MG PO TABS
50.0000 mg | ORAL_TABLET | Freq: Every evening | ORAL | Status: DC | PRN
  Administered 2013-05-19: 50 mg via ORAL
  Filled 2013-05-18: qty 1

## 2013-05-18 MED ORDER — ALBUTEROL SULFATE HFA 108 (90 BASE) MCG/ACT IN AERS: 1.0000 | INHALATION_SPRAY | RESPIRATORY_TRACT | Status: DC | PRN

## 2013-05-18 MED ORDER — PNEUMOCOCCAL VAC POLYVALENT 25 MCG/0.5ML IJ INJ: 0.5000 mL | INJECTION | INTRAMUSCULAR | Status: DC

## 2013-05-18 MED ORDER — TRAZODONE HCL 50 MG PO TABS
ORAL_TABLET | ORAL | Status: AC
  Administered 2013-05-18: 50 mg
  Filled 2013-05-18: qty 1

## 2013-05-18 NOTE — ED Notes (Signed)
Report called to Careers adviser at [facility]. 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 [employer] will transport as he is IVC'd.
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
## 2013-05-18 NOTE — Consult Note (Signed)
Pt accepted to [facility] by Dr. Lolly Mustache to bed 403-1 to the services of Dr. Jannifer Franklin.    Pt will go by [facility] due to IVC  Nurse to call report [phone]  Paper work to go with pt to [facility]

## 2013-05-18 NOTE — ED Notes (Addendum)
Pt. In new blue scrubs. Pt. And belongings searched and wanded by security.pt. Has 1 belongings bag. Pt. Has wooden cane, shirt, pants,underpants, socks, 2sets of keys. cell phone.pt. Belongings locked up at the nurses station in the yellow zone.

## 2013-05-18 NOTE — ED Notes (Addendum)
[facility] notified of transportation needed to [facility] as he is IVC'd.

## 2013-05-18 NOTE — BH Assessment (Signed)
Writer informed the ER MD that the patients will be assessed by extender in the morning for the final disposition.
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
## 2013-05-18 NOTE — Progress Notes (Signed)
Patient ID: Jonathan Smith, male   DOB: [birth year], 57 y.o.   MRN: [id number] Nursing admission note:  Patient is a 57 yo male admitted for hypomania and psychosis.  Patient reports that he has been having auditory hallucinations for 4 days.  He has not slept in 4 days.  Patient also states that people are talking about him on facebook and stealing from his bank account.  Patient denies SI/HI.  Patient has a prior hx of substance abuse and states he doesn't remember the last use of crack cocaine.  Patient states, "It's been so long ago I really don't remember."  Patient states he occasionally drinks, however, he stopped 3 months ago due to his use of opana.  Patient takes opana for his chronic back pain.  He states that he is unsteady on his feet due to weakness in his legs.  He has a cane that is secured in the search room.  He has a walker that he can use on the unit, however, he has been ambulating without it.  Patient has a hx of Bipolar 1 and has frequent mood swings.  His speech is rapid and pressured.  He exhibits flight of ideas and disorganized thought processes.  His med hx includes chronic back pain, COPD and a lung nodule. Patient has been admitted to room 400-1.  He was given nutrition and oriented to unit.

## 2013-05-18 NOTE — ED Notes (Signed)
[facility] here to transport to [facility]. Ambulatory without diificulty. Belongings bags x2 given to [employer]. Labile on discharge.
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
## 2013-05-18 NOTE — Tx Team (Signed)
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  The patient and family have been given the opportunity to ask questions and make suggestions.  Cranford Mon 05/18/2013, 1:19 PM

## 2013-05-18 NOTE — ED Notes (Signed)
Patient arrived to unit, hyperverbal and tearful on arrival. Pt talking loudly, pt asked by staff to hold his voice down as others on unit were asleep. Pt states "I don't care, can't you see I'm being held against my will?" Pt difficult to redirect and is labile with staff. No s/s of distress noted. Pt demanding that he be placed on his home O2 stating he needs it to sleep. Camelia Eng, Charge nurse aware. Pt returned to his room.

## 2013-05-19 DIAGNOSIS — F29 Unspecified psychosis not due to a substance or known physiological condition: Secondary | ICD-10-CM

## 2013-05-19 MED ORDER — OLANZAPINE 10 MG PO TBDP
10.0000 mg | ORAL_TABLET | Freq: Once | ORAL | Status: DC
  Filled 2013-05-19: qty 1

## 2013-05-19 MED ORDER — HYDROXYZINE HCL 50 MG PO TABS
50.0000 mg | ORAL_TABLET | Freq: Once | ORAL | Status: AC
  Administered 2013-05-19: 50 mg via ORAL
  Filled 2013-05-19 (×2): qty 1

## 2013-05-19 MED ORDER — OLANZAPINE 10 MG PO TBDP: 10.0000 mg | ORAL_TABLET | Freq: Three times a day (TID) | ORAL | Status: DC | PRN

## 2013-05-19 MED ORDER — OLANZAPINE 10 MG PO TABS
ORAL_TABLET | ORAL | Status: AC
  Administered 2013-05-19: 12:00:00
  Filled 2013-05-19: qty 1

## 2013-05-19 NOTE — Progress Notes (Signed)
Pt up in hallway asking for something to help him sleep. Staff reminded pt he had already been medicated with trazadone. Pt stated to this nurse, "how could you just cut me off of my xanax like that?" Clarified with pt that he takes xanax as a prescribed home med to which pt stated he did. Asked pt how that could be since his UDS is negative. Pt snapped, "that's bullshit." Offered to call NP to see about a non benzo prn which he was agreeable to. Order received for vistaril 50mg  which was given. Pt questioned pill and packaging. "You're going to have to prove it to me. This isn't no psychodelic is it?" Pt went on to say that it's impossible that his UDS was negative for opiates as his last dose of opana was at 0600 yesterday (05/17/13). Asked pt what pharmacy he uses for the above Rx's and pt replied, "well, that's the thing. It's going to be [facility] if I ever get out of here. [facility] on [address] kept lying to me that they didn't have my 10s (opana 10mg )." Pt becoming agitated with conversation. "You all are lying on me. There's going to be a lawsuit. That doctor that sent me here when I'm just throwing up blood. What's being done about that? Those 2 nurses were laughing at me today." Pt reassured staff was not laughing at him and also assured him that this writer had been nothing but honest with him. Encouraged him to go lie down and allow medicine to work as patient has now been standing in the hallway talking for the last 75 minutes. Pt complied. Lawrence Marseilles

## 2013-05-19 NOTE — Progress Notes (Signed)
1:1 Note Pt. In hallway. Calm and cooperative. No distress noted at this time. Pt denies SI/HI/AVH. Pt. Reports having pain everywhere 7/10 rating. Pt stated he is tired of repeating himself to people. Pt stated he doesn't trust anyone and prefers to be left alone. No further complaints at this time. Pt remains safe on unit. Continue 1:1 monitoring

## 2013-05-19 NOTE — Progress Notes (Signed)
Patient ID: Jonathan Smith, male   DOB: [birth year], 57 y.o.   MRN: [id number] Pt in quiet room.  1:1 observation in progress per MD order.  Pt was up using the bathroom.  Additional needs assessed.  Pt requested something to drink which was provided.  Pt denied additional needs/pain.  Pt denied SI, HI and AVH.  Pt denied ever having thoughts of wanting to harm himself or anyone else.  Pt returned to bed positioning self to face the wall.  Support and encouragement was provided.  Pt safe on unit.

## 2013-05-19 NOTE — Progress Notes (Signed)
CSW attempted to complete PSA with pt however, pt continues to display manic symptoms.  He is hyper verbal and tangential.  Pt behavior erratic and mood labile in nature quickly switching between tearful expressions, hyper presentation, and irritability.  Unable to complete assessment at this time.  Herma Uballe, LCSWA 05/19/2013 11:26 AM
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
## 2013-05-19 NOTE — [facility] Group Notes (Signed)
Adult Psychoeducational Group Note  Date:  05/19/2013 Time:  8:37 PM  Group Topic/Focus:  Wrap-Up Group:   The focus of this group is to help patients review their daily goal of treatment and discuss progress on daily workbooks.  Participation Level:  Did Not Attend  Participation Quality:  None  Affect:  None  Cognitive:  None  Insight: None  Engagement in Group:  None  Modes of Intervention:  Discussion  Additional Comments:  Daysean did not attend group.  Caroll Rancher A 05/19/2013, 8:37 PM

## 2013-05-19 NOTE — [facility] Group Notes (Signed)
[facility] Group Notes: (Clinical Social Work)   05/19/2013      Type of Therapy:  Group Therapy   Participation Level:  Did Not Attend  -  Came into group, burst into tears loudly.  Then became angry because we were playing music, thought it was church.  Agreed to stay and listen to music.  Then left abruptly and angrily, stating "is thi what you call medicine.?"   Ambrose Mantle, LCSW 05/19/2013, 12:22 PM
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
## 2013-05-19 NOTE — Progress Notes (Signed)
1:1 Note: Pt is in room awake, lying in bed. Pt complains of nerve pain in right arm. Pt believes the pain is due to the medications that he was forced to take earlier. Pt strongly verbalizes is disagreement with his diagnosis. There is no such thing as bipolar and schizophrenia. Pt believes scheduled medications are illegal and are going to harm him. Pt can get aggressive and be irritable. Continue 1:1 monitoring.

## 2013-05-19 NOTE — [facility] Suicide Risk Assessment (Signed)
Suicide Risk Assessment  Admission Assessment     Nursing information obtained from:   pt and staff Demographic factors:   male Current Mental Status:   disorganized, pressured speech, angry mood, denies si, no hi, denies avh. He was threatning his care taker write a letter today Loss Factors:   help with living Historical Factors:   unknown Risk Reduction Factors:   wants to get better  CLINICAL FACTORS:   Currently Psychotic  COGNITIVE FEATURES THAT CONTRIBUTE TO RISK:  Loss of executive function    SUICIDE RISK:   Mild:  Suicidal ideation of limited frequency, intensity, duration, and specificity.  There are no identifiable plans, no associated intent, mild dysphoria and related symptoms, good self-control (both objective and subjective assessment), few other risk factors, and identifiable protective factors, including available and accessible social support.  PLAN OF CARE:  Continue risperidone Expand history zyprexa zydis 10 mg 8 hour for agitation  I certify that inpatient services furnished can reasonably be expected to improve the patient's condition.  Wonda Cerise 05/19/2013, 11:25 AM

## 2013-05-19 NOTE — Progress Notes (Addendum)
Pt has had frequent needs tonight. Attention seeking at times. Remains tangential with rapid speech. No insight displayed. Continues to say he was duped into coming here. "I called 911 because of my physical ailments and suddenly I'm told the doctor committed me here. I fasted and prayed for 4 days and God showed me a little bit of his light. The colors of the rainbow. That's not hallucinating." Refused risperdal. Fixated on his pain meds though admits his pain is only about a 5/10. Support given. Med education provided as MS Contin substituted for opana that pt reports taking at home. "I've had 26 surgeries!" Ambulating with walker. Fall precautions reviewed. He denies SI/HI and remains safe. On reassess of pain and trazadone, pt is asleep. Lawrence Marseilles

## 2013-05-20 DIAGNOSIS — F22 Delusional disorders: Secondary | ICD-10-CM

## 2013-05-20 DIAGNOSIS — F4310 Post-traumatic stress disorder, unspecified: Secondary | ICD-10-CM

## 2013-05-20 DIAGNOSIS — F411 Generalized anxiety disorder: Secondary | ICD-10-CM

## 2013-05-20 DIAGNOSIS — F1220 Cannabis dependence, uncomplicated: Secondary | ICD-10-CM

## 2013-05-20 DIAGNOSIS — F319 Bipolar disorder, unspecified: Secondary | ICD-10-CM

## 2013-05-20 DIAGNOSIS — F39 Unspecified mood [affective] disorder: Secondary | ICD-10-CM

## 2013-05-20 MED ORDER — HALOPERIDOL 2 MG PO TABS
2.0000 mg | ORAL_TABLET | Freq: Every day | ORAL | Status: DC
  Administered 2013-05-20: 2 mg via ORAL
  Filled 2013-05-20 (×2): qty 1

## 2013-05-20 MED ORDER — TRAZODONE HCL 100 MG PO TABS: 100.0000 mg | ORAL_TABLET | Freq: Every evening | ORAL | Status: DC | PRN

## 2013-05-20 MED ORDER — MORPHINE SULFATE ER 15 MG PO TBCR
60.0000 mg | EXTENDED_RELEASE_TABLET | Freq: Two times a day (BID) | ORAL | Status: DC
  Administered 2013-05-20 – 2013-05-21 (×2): 60 mg via ORAL
  Filled 2013-05-20 (×2): qty 4

## 2013-05-20 MED ORDER — CITALOPRAM HYDROBROMIDE 20 MG PO TABS: 20.0000 mg | ORAL_TABLET | Freq: Every day | ORAL | Status: DC

## 2013-05-20 MED ORDER — HYDROXYZINE HCL 25 MG PO TABS: 25.0000 mg | ORAL_TABLET | Freq: Four times a day (QID) | ORAL | Status: DC | PRN

## 2013-05-20 MED ORDER — TRAZODONE HCL 50 MG PO TABS
50.0000 mg | ORAL_TABLET | Freq: Once | ORAL | Status: DC
  Filled 2013-05-20 (×2): qty 1

## 2013-05-20 MED ORDER — TRAZODONE HCL 100 MG PO TABS
100.0000 mg | ORAL_TABLET | Freq: Every day | ORAL | Status: DC
  Administered 2013-05-21: 100 mg via ORAL
  Filled 2013-05-20 (×3): qty 1

## 2013-05-20 MED ORDER — TRAZODONE HCL 100 MG PO TABS: 100.0000 mg | ORAL_TABLET | Freq: Every day | ORAL | Status: DC

## 2013-05-20 MED ORDER — CITALOPRAM HYDROBROMIDE 20 MG PO TABS
20.0000 mg | ORAL_TABLET | Freq: Every day | ORAL | Status: DC
  Administered 2013-05-20: 20 mg via ORAL
  Filled 2013-05-20 (×2): qty 1

## 2013-05-20 NOTE — Progress Notes (Signed)
[facility] MD Progress Note  05/20/2013 10:53 AM Jonathan Smith  MRN:  [id number] Subjective: " I brought myself to the hospital because I was having hallucination due to not sleeping for 4 days." Objective: Patient reports prior history of PTSD due to trauma that he suffered when he was young. He states that he was brought to the hospital after he started hallucinating, getting irritable and paranoid due to not sleeping for 4 days . He reports that he did not sleep because he was looking out to see "planet [place], the 10th planet" that he claimed only comes around once every 336 years. He denies having bipolar disorder but he reports that he got " a little excited" when he was in the emergency room. He reports that his only problem are anxiety, chronic back pain and PTSD. Today, he denies auditory hallucinations, depression or suicide ideation, intent or plan. He continues to endorse anxiety, excessive worries, flashback to his childhood trauma. Diagnosis:   DSM5: Schizophrenia Disorders:  Delusional Disorder (297.1) Obsessive-Compulsive Disorders:  denies Trauma-Stressor Disorders:  Posttraumatic Stress Disorder (309.81) Substance/Addictive Disorders:  Cannabis Use Disorder - Moderate 9304.30) Depressive Disorders:  Disruptive Mood Dysregulation Disorder (296.99)  Axis I: Unspecified anxiety disorder            PTSD by history           Bipolar unspecified   ADL's:  Intact  Sleep: Poor  Appetite:  Fair  Suicidal Ideation:  Plan:  denies Intent:  denies Means:  denies Homicidal Ideation:  Plan:  denies Intent:  denies Means:  denies AEB (as evidenced by):  Psychiatric Specialty Exam: Review of Systems  Constitutional: Negative.   HENT: Negative.   Eyes: Negative.   Respiratory: Negative.   Cardiovascular: Negative.   Gastrointestinal: Negative.   Genitourinary: Negative.   Musculoskeletal: Positive for back pain, joint pain and myalgias.  Skin: Negative.   Neurological:  Negative.   Endo/Heme/Allergies: Negative.   Psychiatric/Behavioral: Positive for hallucinations. The patient is nervous/anxious and has insomnia.     Blood pressure 112/79, pulse 73, temperature 97.3 F (36.3 C), temperature source Oral, resp. rate 18, height 5' 6.5" (1.689 m), weight 70.761 kg (156 lb).Body mass index is 24.8 kg/(m^2).  General Appearance: Fairly Groomed  Patent attorney::  Minimal  Speech:  Clear and Coherent  Volume:  Increased  Mood:  Euphoric  Affect:  Labile  Thought Process:  Circumstantial  Orientation:  Full (Time, Place, and Person)  Thought Content:  Paranoid Ideation  Suicidal Thoughts:  No  Homicidal Thoughts:  No  Memory:  Immediate;   Fair Recent;   Fair Remote;   Fair  Judgement:  marginal  Insight:  Shallow  Psychomotor Activity:  Increased  Concentration:  Fair  Recall:  Fair  Akathisia:  No  Handed:  Right  AIMS (if indicated):     Assets:  Communication Skills Desire for Improvement Social Support  Sleep:  Number of Hours: 1.75   Current Medications: Current Facility-Administered Medications  Medication Dose Route Frequency Provider Last Rate Last Dose  . acetaminophen (TYLENOL) tablet 650 mg  650 mg Oral Q6H PRN Earney Navy, NP   650 mg at 05/19/13 2342  . albuterol (PROVENTIL HFA;VENTOLIN HFA) 108 (90 BASE) MCG/ACT inhaler 1-2 puff  1-2 puff Inhalation Q4H PRN Earney Navy, NP      . alum & mag hydroxide-simeth (MAALOX/MYLANTA) 200-200-20 MG/5ML suspension 30 mL  30 mL Oral Q4H PRN Earney Navy, NP      .  citalopram (CELEXA) tablet 20 mg  20 mg Oral QHS Nicasio Barlowe      . haloperidol (HALDOL) tablet 2 mg  2 mg Oral QHS Keirsten Matuska      . hydrOXYzine (ATARAX/VISTARIL) tablet 25 mg  25 mg Oral Q6H PRN Raquan Iannone      . magnesium hydroxide (MILK OF MAGNESIA) suspension 30 mL  30 mL Oral Daily PRN Earney Navy, NP   30 mL at 05/19/13 2025  . morphine (MS CONTIN) 12 hr tablet 60 mg  60 mg Oral Q12H Latiana Tomei      . OLANZapine zydis (ZYPREXA) disintegrating tablet 10 mg  10 mg Oral Q8H PRN Wonda Cerise, MD      . pneumococcal 23 valent vaccine (PNU-IMMUNE) injection 0.5 mL  0.5 mL Intramuscular Tomorrow-1000 Calvary Difranco      . traZODone (DESYREL) tablet 100 mg  100 mg Oral QHS Jonella Redditt        Lab Results:  Results for orders placed during the hospital encounter of 05/18/13 (from the past 48 hour(s))  TSH     Status: None   Collection Time    05/18/13  7:31 PM      Result Value Range   TSH 3.701  0.350 - 4.500 uIU/mL   Comment: Performed at [facility]    Physical Findings: AIMS: Facial and Oral Movements Muscles of Facial Expression: None, normal Lips and Perioral Area: None, normal Jaw: None, normal Tongue: None, normal,Extremity Movements Upper (arms, wrists, hands, fingers): None, normal Lower (legs, knees, ankles, toes): None, normal, Trunk Movements Neck, shoulders, hips: None, normal, Overall Severity Severity of abnormal movements (highest score from questions above): None, normal Incapacitation due to abnormal movements: None, normal Patient's awareness of abnormal movements (rate only patient's report): No Awareness, Dental Status Current problems with teeth and/or dentures?: No Does patient usually wear dentures?: No  CIWA:    COWS:     Treatment Plan Summary: Daily contact with patient to assess and evaluate symptoms and progress in treatment Medication management  Plan:1. Admit for crisis management and stabilization. 2. Medication management to reduce current symptoms to base line and improve the     patient's overall level of functioning 3. Treat health problems as indicated. 4. Develop treatment plan to decrease risk of relapse upon discharge and the need for     readmission. 5. Psycho-social education regarding relapse prevention and self care. 6. Health care follow up as needed for medical problems. 7. Restart home medications where  appropriate. 8. Haldol 2mg  po Qhs for delusions 9. Celexa 20mg  po daily for PTSD  Medical Decision Making Problem Points:  Established problem, stable/improving (1), Review of last therapy session (1) and Review of psycho-social stressors (1) Data Points:  Order Aims Assessment (2) Review of medication regiment & side effects (2) Review of new medications or change in dosage (2)  I certify that inpatient services furnished can reasonably be expected to improve the patient's condition.   Thedore Mins, MD 05/20/2013, 10:53 AM

## 2013-05-20 NOTE — [facility] Suicide Risk Assessment (Signed)
[facility] INPATIENT:  Family/Significant Other Suicide Prevention Education  Suicide Prevention Education:  Education Completed; Michel Bickers, friend and caregiver, [phone]  has been identified by the patient as the family member/significant other with whom the patient will be residing, and identified as the person(s) who will aid the patient in the event of a mental health crisis (suicidal ideations/suicide attempt).  With written consent from the patient, the family member/significant other has been provided the following suicide prevention education, prior to the and/or following the discharge of the patient.  The suicide prevention education provided includes the following:  Suicide risk factors  Suicide prevention and interventions  National Suicide Hotline telephone number  [facility] assessment telephone number  [facility] Emergency Assistance 911  [facility] and/or Residential Mobile Crisis Unit telephone number  Request made of family/significant other to:  Remove weapons (e.g., guns, rifles, knives), all items previously/currently identified as safety concern.    Remove drugs/medications (over-the-counter, prescriptions, illicit drugs), all items previously/currently identified as a safety concern.  The family member/significant other verbalizes understanding of the suicide prevention education information provided.  The family member/significant other agrees to remove the items of safety concern listed above.  Daryel Gerald B 05/20/2013, 11:57 AM

## 2013-05-20 NOTE — [facility] Counselor (Signed)
Adult Comprehensive Assessment  Patient ID: Jonathan Smith, male   DOB: May 30, 1956, 57 y.o.   MRN: [id number]  Information Source: Information source: Patient  Current Stressors:  Educational / Learning stressors: N/A Employment / Job issues: Disabled Family Relationships: Reports relationship with son is Training and development officer / Lack of resources (include bankruptcy): Yes  fixed income Housing / Lack of housing: N/A Physical health (include injuries & life threatening diseases): Chronic back pain Social relationships: N/A Substance abuse: Denies, but is asking regularly for opiates and benzos Bereavement / Loss: N/A  Living/Environment/Situation:  Living Arrangements: Alone Living conditions (as described by patient or guardian): "It's good.  My caretaker and good friend Darl Pikes is there.  She works second shift, but is there for me the rest of the time." How long has patient lived in current situation?: 8 years What is atmosphere in current home: Comfortable;Supportive;Loving  Family History:  Marital status: Divorced Divorced, when?: Unk What types of issues is patient dealing with in the relationship?: Unk Additional relationship information: Unk Does patient have children?: Yes How many children?: 1 How is patient's relationship with their children?: son age 31  Childhood History:     Education:     Employment/Work Situation:   Employment situation: On disability  Architect:      Alcohol/Substance Abuse:      Social Support System:      Leisure/Recreation:      Strengths/Needs:      Discharge Plan:      Summary/Recommendations:   Emergency planning/management officer and Recommendations (to be completed by the evaluator): Braylon started out pleasantly enough, but quickly became irritable, resulting in speech that was increasingly pressured and tangential.  He explained that he was in alot of pain, and needed to lie down.  I asked if I could interview him in his room, but he cut  me off.  The Dr plans of d/cing him tomorrow, so this is the extent of his assessment.  I spoke with his caretaker, who assured me she is not concerned for his safety, and states that all his problems have their genesis in medical issues and physcial pain.  She agreed to make sure he will get to mental health for follow up.  She is relieved to hear he will be d/ced tomorrow.  "I know you'all are there to help him, but he does not need to be at your hospital."  She agreed to pick him up tomorrow at noon.  Dustine can benefit from crises stabilization, medication managment, therapeutic milieu and referral for services.  Daryel Gerald B. 05/20/2013

## 2013-05-20 NOTE — Progress Notes (Signed)
Recreation Therapy Notes  Date: 12.15.2014 Time: 9:30am Location: [address] Dayroom   Group Topic: Wellness  Goal Area(s) Addresses:  Patient will define components of whole wellness. Patient will verbalize benefit of whole wellness.  Behavioral Response: Appropriate   Intervention: Group Mind Map  Activity: Patients were asked to create a group flow chart outlining the dimension of wellness, as well as components of each dimension.    Education: Wellness, Building control surveyor.    Education Outcome: Acknowledges understanding  Clinical Observations/Feedback: Patient with peers identified the following dimensions of wellness: physical, emotional, mental, spiritual, leisure and community. Patient gave examples of each dimension as well as successfully related each dimension to one another.  Patient made the statement during group that the mind map as it was drawn by LRT resembled a cross to him. Patient then spoke at length about his religion and being accepted for the religious beliefs individuals practice.   Jonathan Smith, LRT/CTRS  Jonathan Smith 05/20/2013 11:49 AM

## 2013-05-20 NOTE — Progress Notes (Signed)
[facility] Post 1:1 Observation Documentation  For the first (8) hours following discontinuation of 1:1 precautions, a progress note entry by nursing staff should be documented at least every 2 hours, reflecting the patient's behavior, condition, mood, and conversation.  Use the progress notes for additional entries.  Time 1:1 discontinued:  0945   Patient's Behavior:  Patient is hypomanic.  His behavior is appropriate at this time.     Patient's Condition:  Patient's physical condition is stable.  He remains manic and restless.  Patient's Conversation:  Patient has been in day room conversing with his peers.  He met with MD and discussed his treatment plan.  Cranford Mon 05/20/2013, 9:57 AM

## 2013-05-20 NOTE — Progress Notes (Signed)
[facility] Post 1:1 Observation Documentation  For the first (8) hours following discontinuation of 1:1 precautions, a progress note entry by nursing staff should be documented at least every 2 hours, reflecting the patient's behavior, condition, mood, and conversation.  Use the progress notes for additional entries.  Time 1:1 discontinued:  0945   Patient's Behavior:  Patient has been calm and cooperative.  His behavior is appropriate.  Patient's Condition:  Patient's physical condition is stable.  Patient's Conversation:  Patient is asleep at this time.  Cranford Mon 05/20/2013, 2:46 PM

## 2013-05-20 NOTE — [facility] Group Notes (Signed)
[facility] LCSW Group Therapy  05/20/2013 1:15 pm  Type of Therapy: Process Group Therapy  Participation Level:  Jonathan Smith initially attended, and was engaged by giving others feedback, but before we got to him identifying his obstacles, he asked for permission to leave, stating his back was hurting so much he needed to go lay down.  Participation Quality:  Appropriate  Affect:  Flat  Cognitive:  Oriented  Insight:  Improving  Engagement in Group:  Limited  Engagement in Therapy:  Limited  Modes of Intervention:  Activity, Clarification, Education, Problem-solving and Support  Summary of Progress/Problems: Today's group addressed the issue of overcoming obstacles.  Patients were asked to identify their biggest obstacle post d/c that stands in the way of their on-going success, and then problem solve as to how to manage this.  Jonathan Smith 05/20/2013   3:52 PM

## 2013-05-20 NOTE — Progress Notes (Addendum)
Patient ID: Jonathan Smith, male   DOB: June 05, 1956, 57 y.o.   MRN: [id number] D: patient continues attention seeking behavior. His 1:1 observation was discontinued at 0945.  He remains hypomanic with rapid, pressured speech. His mood is euphoric.  Patient believes that "Ova Fresh[place] and Felix Pacini are responsible for 9/11." "I know it's true I saw it on Facebook."  "The Russians know the truth too.  The UN is going to take over this country."  "I'm a patriot." Patient becomes anxious when talking about facebook and tried to deactivate his account.  Patient states he had not slept for 4 days before admission. He complains of chronic back which he takes Opana at home.  He has also requested xanax for sleep. Patient's walker was discontinued as patient was using it incorrectly and he is ambulating well.  Patient spoke with MD regarding his medications and he stated, "I love the morphine.  It has help tremendously."  Patient was educated on the use of too much pain medication.  His morphine has been reduced to 60 mg q 12 hours. He denies any SI/HI/AVH.  A: continue to monitor medication management and MD orders.  Safety checks completed every 15 minutes per protocol.  R: patient is redirectable at this time.

## 2013-05-20 NOTE — Progress Notes (Signed)
1:1 Note: Pt in bed asleep. Unlabored even breathing. No distress seen or complaints at this time. Continue 1:1 monitoring.

## 2013-05-20 NOTE — Progress Notes (Signed)
Patient ID: Jonathan Smith, male   DOB: 1956-04-20, 57 y.o.   MRN: [id number] D: pt. Pacing the hall, intrusive. Reports day been great, "I'm going home tomorrow" Pain level "8" in low back. A: Writer introduced self to client, reviewed eight o'clock med to be given, administered MS 60 mg po. Staff will monitor q53min for safety. Writer encouraged group.  R: Pt. Is safe on the unit and attended group.

## 2013-05-20 NOTE — [facility] Group Notes (Signed)
[facility] LCSW Aftercare Discharge Planning Group Note   05/20/2013 8:04 AM  Participation Quality:  Engaged  Mood/Affect:  Excited  Depression Rating:  denies  Anxiety Rating:  7  Thoughts of Suicide:  No Will you contract for safety?   NA  Current AVH:  No  Plan for Discharge/Comments:  Elye began by c/o how distracting it is here with people going in and out and interrupting.  "I got flipped on Facebook"  Related how he has become increasingly anxious and worried about the end times because of a planet he has been following.  Was fasting and staying awake "which didn't help the situation, I know."  Wants to clarify that he is not bipolar, but has PTSD.  Is on disability due to rod in back, and has a med tech in the home that helps him out.  Denies many things that he obviously has been confronted on during his stay here.  "I take my meds as prescribed, I don't do drugs, I was not planning on hurting myself and do not her\ar voices.  Pressured speech, not easily redirectable.  Tangential and circumstantial in speech.  Transportation Means: unk  Supports: med tecj, family  [place], [place]

## 2013-05-20 NOTE — Tx Team (Signed)
  Interdisciplinary Treatment Plan Update   Date Reviewed:  05/20/2013  Time Reviewed:  8:04 AM  Progress in Treatment:   Attending groups: Yes Participating in groups: Yes Taking medication as prescribed: Yes  Tolerating medication: Yes Family/Significant other contact made: Yes  Patient understands diagnosis: Yes AEB requesting help with anxiety Discussing patient identified problems/goals with staff: Yes  See initial care plan Medical problems stabilized or resolved: Yes Denies suicidal/homicidal ideation: Yes  In tx team Patient has not harmed self or others: Yes  For review of initial/current patient goals, please see plan of care.  Estimated Length of Stay:  1-3 days  Reason for Continuation of Hospitalization: Anxiety Medication stabilization  New Problems/Goals identified:  N/A  Discharge Plan or Barriers:  return home, follow up at mental health   Additional Comments:  Patient is a 57 year old white male. Patient is not orientated to place time or situation. Patient reports that he does not know how and why he came to the hospital. During the assessment with Dr Lolly Mustache, patient was hyper verbal with a loud voice. Patient also exhibited flight of ideas jumping from one topic to another. He states he was diagnosed in 2005 with bipolar but he does not believe he has bipolar. Patient states he has been praying and fasting for 3 days to heal himself of anything happening to him. Patient reports he was under stress and was not getting much sleep before coming to the hospital.    Attendees:  Signature: Thedore Mins, MD 05/20/2013 8:04 AM   Signature: Richelle Ito, LCSW 05/20/2013 8:04 AM  Signature: Fransisca Kaufmann, NP 05/20/2013 8:04 AM  Signature: Joslyn Devon, RN 05/20/2013 8:04 AM  Signature: Liborio Nixon, RN 05/20/2013 8:04 AM  Signature:  05/20/2013 8:04 AM  Signature:   05/20/2013 8:04 AM  Signature:    Signature:    Signature:    Signature:    Signature:     Signature:      Scribe for Treatment Team:   Richelle Ito, LCSW  05/20/2013 8:04 AM

## 2013-05-20 NOTE — Progress Notes (Signed)
The focus of this group is to help patients review their daily goal of treatment and discuss progress on daily workbooks. Pt attended the evening group session but refused to answer discussion prompts from the Writer, opting instead to interrupt his peers as they spoke to discuss what medications he wanted, complain about staff and his fellow patients before leaving group. Pt returned twice and left twice more. Pt was intrusive to his peers and did not respond well to attempts at redirection from the Writer. Pt presented with manic behavior.

## 2013-05-21 DIAGNOSIS — F3110 Bipolar disorder, current episode manic without psychotic features, unspecified: Principal | ICD-10-CM

## 2013-05-21 MED ORDER — TRAZODONE HCL 100 MG PO TABS: 100.0000 mg | ORAL_TABLET | Freq: Every day | ORAL | Status: DC

## 2013-05-21 MED ORDER — HALOPERIDOL 2 MG PO TABS: 2.0000 mg | ORAL_TABLET | Freq: Every day | ORAL | Status: DC

## 2013-05-21 MED ORDER — CITALOPRAM HYDROBROMIDE 20 MG PO TABS: 20.0000 mg | ORAL_TABLET | Freq: Every day | ORAL | Status: DC

## 2013-05-21 NOTE — Tx Team (Signed)
  Interdisciplinary Treatment Plan Update   Date Reviewed:  05/21/2013  Time Reviewed:  9:07 AM  Progress in Treatment:   Attending groups: Yes Participating in groups: Yes Taking medication as prescribed: Yes  Tolerating medication: Yes Family/Significant other contact made: Yes  Patient understands diagnosis: Yes  Discussing patient identified problems/goals with staff: Yes Medical problems stabilized or resolved: Yes Denies suicidal/homicidal ideation: Yes Patient has not harmed self or others: Yes  For review of initial/current patient goals, please see plan of care.  Estimated Length of Stay:  d/c today  Reason for Continuation of Hospitalization:   New Problems/Goals identified:  N/A  Discharge Plan or Barriers:   return home, follow up outpt  Additional Comments:  Attendees:  Signature: Thedore Mins, MD 05/21/2013 9:07 AM   Signature: Richelle Ito, LCSW 05/21/2013 9:07 AM  Signature: Fransisca Kaufmann, NP 05/21/2013 9:07 AM  Signature: Joslyn Devon, RN 05/21/2013 9:07 AM  Signature: Liborio Nixon, RN 05/21/2013 9:07 AM  Signature:  05/21/2013 9:07 AM  Signature:   05/21/2013 9:07 AM  Signature:    Signature:    Signature:    Signature:    Signature:    Signature:      Scribe for Treatment Team:   Richelle Ito, LCSW  05/21/2013 9:07 AM

## 2013-05-21 NOTE — Discharge Summary (Signed)
Physician Discharge Summary Note  Patient:  Jonathan Smith is an 57 y.o., male MRN:  [id number] DOB:  [birth year] Patient phone:  [phone] (home)  Patient address:   [address]  [place] [address],   Date of Admission:  05/18/2013 Date of Discharge: 05/21/13  Reason for Admission: Bipolar Mania   Discharge Diagnoses: Active Problems:   Bipolar affective disorder, current episode manic  Review of Systems  Constitutional: Negative.   HENT: Negative.   Eyes: Negative.   Respiratory: Negative.   Cardiovascular: Negative.   Gastrointestinal: Negative.   Genitourinary: Negative.   Musculoskeletal: Positive for back pain.  Skin: Negative.   Neurological: Negative.   Endo/Heme/Allergies: Negative.   Psychiatric/Behavioral: Negative for depression, suicidal ideas, hallucinations, memory loss and substance abuse. The patient is nervous/anxious. The patient does not have insomnia.    DSM5:  AXIS I: Bipolar affective disorder, current episode manic  AXIS II: Deferred  AXIS III:  Past Medical History   Diagnosis  Date   .  Lung nodule    .  COPD (chronic obstructive pulmonary disease)    .  Chronic back pain     AXIS IV: economic problems, occupational problems, other psychosocial or environmental problems and problems related to social environment  AXIS V: 61-70 mild symptoms  Level of Care:  OP  Hospital Course:  Patient is a 57 year old white male. Patient is not orientated to place time or situation. Patient reports that he does not know how and why he came to the hospital. During the assessment with Dr Lolly Mustache, patient was hyper verbal with a loud voice. Patient also exhibited flight of ideas jumping from one topic to another. He states he was diagnosed in 2005 with bipolar but he does not believe he has bipolar. Patient states he has been praying and fasting for 3 days to heal himself of anything happening to him. Patient reports he was under stress and was not getting  much sleep before coming to the hospital. He reports getting some sleep last night and is now ready to leave. Patient repeated stated he uses fasting and prayers to heal self. He also states that people are talking about him on Face Book but he is trying to get off face book . Patient is not able to remember if he has been psychiatrically hospitalized in the past. Patient reports that he does take medication for anxiety but does not remember who his doctor is. Patient reports a prior history of substance abuse. Patient denies current substance abuse. Patient reports that he is not able to remember the last time that he has used cocaine. Patient only remembered taking Xanax for anxiety, Opana for pain coming from the rod in his back. He does not remember any of his Psychiatric medication and that he was hospitalized here in our [facility] 2005.          Jonathan Smith was admitted to the adult unit. He was evaluated and his symptoms were identified. Medication management was discussed and initiated. Patient was requesting to be put on xanax for his anxiety and to have opiates ordered for his pain. He was upset when told these would not be ordered for him. Patient was started on morphine which was decreased by the Psychiatrist. His UDS on admission was noted to be negative for opiates. The patient continued to express frustration with his medications. Patient was noted to demonstrate manic behaviors He was oriented to the unit and encouraged to participate in  unit programming. Medical problems were identified and treated appropriately. Home medication was restarted as needed. Patient reported he would get his regular medications from his MD.         The patient was evaluated each day by a clinical provider to ascertain the patient's response to treatment.  Improvement was noted by the patient's report of decreasing symptoms, improved sleep and appetite, affect, medication tolerance, behavior, and participation in unit  programming.  Jonathan Smith was asked each day to complete a self inventory noting mood, mental status, pain, new symptoms, anxiety and concerns.         He responded well to medication and being in a therapeutic and supportive environment. Positive and appropriate behavior was noted and the patient was motivated for recovery.  [name] worked closely with the treatment team and case manager to develop a discharge plan with appropriate goals. Coping skills, problem solving as well as relaxation therapies were also part of the unit programming.         By the day of discharge JAQUARIOUS Smith was in much improved condition than upon admission.  Symptoms were reported as significantly decreased or resolved completely.  The patient denied SI/HI and voiced no AVH. He was motivated to continue taking medication with a goal of continued improvement in mental health.          Jonathan Smith was discharged home with a plan to follow up as noted below.  Consults:  psychiatry  Significant Diagnostic Studies:  labs: Admission labs completed   Discharge Vitals:   Blood pressure 114/76, pulse 65, temperature 97.2 F (36.2 C), temperature source Oral, resp. rate 18, height 5' 6.5" (1.689 m), weight 70.761 kg (156 lb). Body mass index is 24.8 kg/(m^2). Lab Results:   Results for orders placed during the hospital encounter of 05/18/13 (from the past 72 hour(s))  TSH     Status: None   Collection Time    05/18/13  7:31 PM      Result Value Range   TSH 3.701  0.350 - 4.500 uIU/mL   Comment: Performed at [facility]    Physical Findings: AIMS: Facial and Oral Movements Muscles of Facial Expression: None, normal Lips and Perioral Area: None, normal Jaw: None, normal Tongue: None, normal,Extremity Movements Upper (arms, wrists, hands, fingers): None, normal Lower (legs, knees, ankles, toes): None, normal, Trunk Movements Neck, shoulders, hips: None, normal, Overall Severity Severity of  abnormal movements (highest score from questions above): None, normal Incapacitation due to abnormal movements: None, normal Patient's awareness of abnormal movements (rate only patient's report): No Awareness, Dental Status Current problems with teeth and/or dentures?: No Does patient usually wear dentures?: No  CIWA:    COWS:     Psychiatric Specialty Exam: See Psychiatric Specialty Exam and Suicide Risk Assessment completed by Attending Physician prior to discharge.  Discharge destination:  Home  Is patient on multiple antipsychotic therapies at discharge:  No   Has Patient had three or more failed trials of antipsychotic monotherapy by history:  No  Recommended Plan for Multiple Antipsychotic Therapies: NA  Discharge Orders   Future Orders Complete By Expires   Discharge instructions  As directed    Comments:     Please follow up with your Primary Care Provider for further management of your medical problems.       Medication List    STOP taking these medications       ALPRAZolam 1 MG tablet  Commonly  known as:  XANAX     oxymorphone 10 MG tablet  Commonly known as:  OPANA     oxymorphone 40 MG 12 hr tablet  Commonly known as:  OPANA ER      TAKE these medications     Indication   albuterol 108 (90 BASE) MCG/ACT inhaler  Commonly known as:  PROVENTIL HFA;VENTOLIN HFA  Inhale 1-2 puffs into the lungs every 4 (four) hours as needed for wheezing.      citalopram 20 MG tablet  Commonly known as:  CELEXA  Take 1 tablet (20 mg total) by mouth at bedtime.   Indication:  Depression, Posttraumatic Stress Disorder     haloperidol 2 MG tablet  Commonly known as:  HALDOL  Take 1 tablet (2 mg total) by mouth at bedtime.   Indication:  Psychosis     traZODone 100 MG tablet  Commonly known as:  DESYREL  Take 1 tablet (100 mg total) by mouth at bedtime.   Indication:  Anxiety Disorder, Trouble Sleeping           Follow-up Information   Follow up with [facility].  (Go to the walk-in clinic M-F between 8 and 10AM for your hospital follow up appointment)    Contact information:   [address]  [place] [phone]      Follow-up recommendations:   Activity: as tolerated  Diet: healthy  Tests: routine  Other: patient to keep his after care appointment   Comments:   Take all your medications as prescribed by your mental healthcare provider.  Report any adverse effects and or reactions from your medicines to your outpatient provider promptly.  Patient is instructed and cautioned to not engage in alcohol and or illegal drug use while on prescription medicines.  In the event of worsening symptoms, patient is instructed to call the crisis hotline, 911 and or go to the nearest ED for appropriate evaluation and treatment of symptoms.  Follow-up with your primary care provider for your other medical issues, concerns and or health care needs.   Total Discharge Time:  Greater than 30 minutes.  SignedFransisca Kaufmann NP-C 05/21/2013, 9:49 AM

## 2013-05-21 NOTE — Progress Notes (Signed)
Patient ID: Jonathan Smith, male   DOB: June 16, 1955, 57 y.o.   MRN: [id number] D: Pt. At med window, argumentative and demanding. "y'all giving me all kind of Sh_t" "I want to know what you giving me" "I was taking Ativan at home and it relaxed me and I slept good, y'all treating me like a gennie  Pig, giving me all kind of different medication" "I want to know what it is" A: Writer reviewed meds and indications, with plans to give pt. A patient friendly med print out in am. Staff will continue to monitor q63min for safety. R: Pt. Is safe on the unit, encouraged to go back to bed.

## 2013-05-21 NOTE — [facility] Suicide Risk Assessment (Signed)
Suicide Risk Assessment  Discharge Assessment     Demographic Factors:  Male, Low socioeconomic status and Unemployed  Mental Status Per Nursing Assessment::   On Admission:     Current Mental Status by Physician: patient denies suicidal ideation, intent, plan  Loss Factors: Financial problems/change in socioeconomic status  Historical Factors: Impulsivity  Risk Reduction Factors:   Patient to be compliance with his medications  Continued Clinical Symptoms:  Chronic Pain  Cognitive Features That Contribute To Risk:  Polarized thinking    Suicide Risk:  Minimal: No identifiable suicidal ideation.  Patients presenting with no risk factors but with morbid ruminations; may be classified as minimal risk based on the severity of the depressive symptoms  Discharge Diagnoses:   AXIS I:  Bipolar affective disorder, current episode manic  AXIS II:  Deferred AXIS III:   Past Medical History  Diagnosis Date  . Lung nodule   . COPD (chronic obstructive pulmonary disease)   . Chronic back pain    AXIS IV:  economic problems, occupational problems, other psychosocial or environmental problems and problems related to social environment AXIS V:  61-70 mild symptoms  Plan Of Care/Follow-up recommendations:  Activity:  as tolerated Diet:  healthy Tests:  routine  Other:  patient to keep his after care appointment  Is patient on multiple antipsychotic therapies at discharge:  No   Has Patient had three or more failed trials of antipsychotic monotherapy by history:  No  Recommended Plan for Multiple Antipsychotic Therapies: NA  Nesanel Aguila,MD 05/21/2013, 11:13 AM

## 2013-05-21 NOTE — Progress Notes (Signed)
[facility] Adult Case Management Discharge Plan :  Will you be returning to the same living situation after discharge: Yes,  home At discharge, do you have transportation home?:Yes,  friend Do you have the ability to pay for your medications:Yes,  MCD  Release of information consent forms completed and in the chart;  Patient's signature needed at discharge.  Patient to Follow up at: Follow-up Information   Follow up with [facility]. (Go to the walk-in clinic M-F between 8 and 10AM for your hospital follow up appointment)    Contact information:   [address]  [place] [phone]      Patient denies SI/HI:   Yes,  yes    Safety Planning and Suicide Prevention discussed:  Yes,  yes  Jonathan Smith B 05/21/2013, 9:08 AM

## 2013-05-21 NOTE — Progress Notes (Signed)
Discharge note: Pt denies SI/HI/AVH at time of discharge. Pt received both written and verbal discharge instructions. Pt agreed to f/u appt and med regimen. Per pt, he will talk to the psychiatrist at [facility] about taking haldol. Pt received all belongs from room and locker. Pt received sample meds and prescriptions. Pt safely left [facility].

## 2013-05-24 NOTE — Progress Notes (Signed)
Patient Discharge Instructions:  After Visit Summary (AVS):   Faxed to:  05/24/13 Discharge Summary Note:   Faxed to:  05/24/13 Psychiatric Admission Assessment Note:   Faxed to:  05/24/13 Suicide Risk Assessment - Discharge Assessment:   Faxed to:  05/24/13 Faxed/Sent to the Next Level Care provider:  05/24/13 Faxed to [facility] @ [phone]  Jerelene Redden, 05/24/2013, 1:06 PM

## 2013-05-24 NOTE — Discharge Summary (Signed)
Seen and agreed. Lakenya Riendeau, MD 

## 2014-01-11 ENCOUNTER — Encounter (HOSPITAL_COMMUNITY): Payer: Self-pay | Admitting: Emergency Medicine

## 2014-01-11 DIAGNOSIS — M545 Low back pain, unspecified: Secondary | ICD-10-CM | POA: Insufficient documentation

## 2014-01-11 DIAGNOSIS — F1010 Alcohol abuse, uncomplicated: Secondary | ICD-10-CM | POA: Diagnosis not present

## 2014-01-11 DIAGNOSIS — J4489 Other specified chronic obstructive pulmonary disease: Secondary | ICD-10-CM | POA: Insufficient documentation

## 2014-01-11 DIAGNOSIS — F17200 Nicotine dependence, unspecified, uncomplicated: Secondary | ICD-10-CM | POA: Diagnosis not present

## 2014-01-11 DIAGNOSIS — G8929 Other chronic pain: Secondary | ICD-10-CM | POA: Diagnosis not present

## 2014-01-11 DIAGNOSIS — Z79899 Other long term (current) drug therapy: Secondary | ICD-10-CM | POA: Diagnosis not present

## 2014-01-11 DIAGNOSIS — J449 Chronic obstructive pulmonary disease, unspecified: Secondary | ICD-10-CM | POA: Diagnosis not present

## 2014-01-11 NOTE — ED Notes (Signed)
Pt. arrived with PTAR  with [employer] officer from home reports chronic upper and low back pain worse today , + ETOH , ambulatory , respirations unlabored . Denies rfecent fall or injury /no urinary discomfort.

## 2014-01-12 ENCOUNTER — Emergency Department (HOSPITAL_COMMUNITY)
Admission: EM | Admit: 2014-01-12 | Discharge: 2014-01-12 | Disposition: A | Discharge status: Home / Self Care | Payer: Medicaid Other | Attending: Emergency Medicine | Admitting: Emergency Medicine

## 2014-01-12 DIAGNOSIS — F10920 Alcohol use, unspecified with intoxication, uncomplicated: Secondary | ICD-10-CM

## 2014-01-12 DIAGNOSIS — M549 Dorsalgia, unspecified: Secondary | ICD-10-CM

## 2014-01-12 DIAGNOSIS — G8929 Other chronic pain: Secondary | ICD-10-CM

## 2014-01-12 LAB — CBC WITH DIFFERENTIAL/PLATELET
Basophils Absolute: 0.1 10*3/uL (ref 0.0–0.1)
Basophils Relative: 1 % (ref 0–1)
EOS PCT: 0 % (ref 0–5)
Eosinophils Absolute: 0 10*3/uL (ref 0.0–0.7)
HCT: 43.1 % (ref 39.0–52.0)
HEMOGLOBIN: 15.6 g/dL (ref 13.0–17.0)
LYMPHS ABS: 2.7 10*3/uL (ref 0.7–4.0)
Lymphocytes Relative: 30 % (ref 12–46)
MCH: 30.4 pg (ref 26.0–34.0)
MCHC: 36.2 g/dL — ABNORMAL HIGH (ref 30.0–36.0)
MCV: 84 fL (ref 78.0–100.0)
MONOS PCT: 8 % (ref 3–12)
Monocytes Absolute: 0.7 10*3/uL (ref 0.1–1.0)
NEUTROS PCT: 61 % (ref 43–77)
Neutro Abs: 5.6 10*3/uL (ref 1.7–7.7)
PLATELETS: 170 10*3/uL (ref 150–400)
RBC: 5.13 MIL/uL (ref 4.22–5.81)
RDW: 12 % (ref 11.5–15.5)
WBC: 9.1 10*3/uL (ref 4.0–10.5)

## 2014-01-12 LAB — BASIC METABOLIC PANEL
Anion gap: 16 — ABNORMAL HIGH (ref 5–15)
BUN: 8 mg/dL (ref 6–23)
CALCIUM: 8.6 mg/dL (ref 8.4–10.5)
CO2: 21 mEq/L (ref 19–32)
Chloride: 102 mEq/L (ref 96–112)
Creatinine, Ser: 0.81 mg/dL (ref 0.50–1.35)
GFR calc non Af Amer: >90 mL/min (ref >90)
GLUCOSE: 108 mg/dL — AB (ref 70–99)
Potassium: 3.9 mEq/L (ref 3.7–5.3)
Sodium: 139 mEq/L (ref 137–147)

## 2014-01-12 LAB — ETHANOL: Alcohol, Ethyl (B): 192 mg/dL — ABNORMAL HIGH (ref 0–11)

## 2014-01-12 NOTE — ED Provider Notes (Signed)
CSN: [id number]     Arrival date & time 01/11/14  2338 History   First MD Initiated Contact with Patient 01/12/14 [phone]     Chief Complaint  Patient presents with  . Back Pain     (Consider location/radiation/quality/duration/timing/severity/associated sxs/prior Treatment) HPI  This is a 58 year old male with a history of COPD and chronic back pain who presents by police escort with complaints of back pain.  Prior to intervening the patient, per police the patient is in the posterior date for domestic. When a attempted to arrest the patient, he began to complain of worsening back pain.  On my evaluation, patient states that he is not being hit in his chronic pain medications at home. He reports that "she only gave me a fifth of vodka and I haven't had any medications since yesterday."  He takes Opana. He states his pain is consistent with his prior chronic pain including low back and thoracic pain. He denies any weakness, numbness, tingling. He denies any difficulty with his bowels or bladder.  Past Medical History  Diagnosis Date  . Lung nodule   . COPD (chronic obstructive pulmonary disease)   . Chronic back pain    Past Surgical History  Procedure Laterality Date  . Cervical spine surgery    . Appendectomy     Family History  Problem Relation Age of Onset  . Emphysema Paternal Grandfather     smoked   History  Substance Use Topics  . Smoking status: Current Every Day Smoker -- 1.00 packs/day for 30 years    Types: Cigarettes  . Smokeless tobacco: Never Used  . Alcohol Use: Yes     Comment: occasional    Review of Systems  Constitutional: Negative.  Negative for fever.  Respiratory: Negative.  Negative for chest tightness and shortness of breath.   Cardiovascular: Negative.  Negative for chest pain.  Gastrointestinal: Negative.   Genitourinary: Negative.  Negative for difficulty urinating.  Musculoskeletal: Positive for back pain. Negative for gait problem.   Neurological: Negative for weakness, numbness and headaches.  All other systems reviewed and are negative.     Allergies  Review of patient's allergies indicates no known allergies.  Home Medications   Prior to Admission medications   Medication Sig Start Date End Date Taking? Authorizing Provider  albuterol (PROVENTIL HFA;VENTOLIN HFA) 108 (90 BASE) MCG/ACT inhaler Inhale 1-2 puffs into the lungs every 4 (four) hours as needed for wheezing. 09/19/12  Yes Richardean Canal, MD  oxymorphone (OPANA ER) 40 MG 12 hr tablet Take 40 mg by mouth 2 (two) times daily.   Yes Historical Provider, MD  oxymorphone (OPANA) 10 MG tablet Take 10 mg by mouth 2 (two) times daily as needed for pain.   Yes Historical Provider, MD  tiZANidine (ZANAFLEX) 4 MG capsule Take 4 mg by mouth 2 (two) times daily.   Yes Historical Provider, MD   BP 117/83  Pulse 70  Temp(Src) 97.3 F (36.3 C) (Oral)  Resp 14  SpO2 95% Physical Exam  Nursing note and vitals reviewed. Constitutional:  Disheveled appearing, slurring his words  HENT:  Head: Normocephalic and atraumatic.  Cardiovascular: Normal rate, regular rhythm and normal heart sounds.   No murmur heard. Pulmonary/Chest: Effort normal and breath sounds normal. No respiratory distress. He has no wheezes.  Musculoskeletal: He exhibits no edema.  No midline tenderness, step-off or deformity  Neurological: He is alert.  5/5 strength in all 4 extremities, normal gait  Skin: Skin is warm and  dry.  Psychiatric:  Appears intoxicated    ED Course  Procedures (including critical care time) Labs Review Labs Reviewed  CBC WITH DIFFERENTIAL - Abnormal; Notable for the following:    MCHC 36.2 (*)    All other components within normal limits  BASIC METABOLIC PANEL - Abnormal; Notable for the following:    Glucose, Bld 108 (*)    Anion gap 16 (*)    All other components within normal limits  ETHANOL - Abnormal; Notable for the following:    Alcohol, Ethyl (B) 192  (*)    All other components within normal limits    Imaging Review No results found.   EKG Interpretation   Date/Time:  Sunday January 12 2014 01:23:22 EDT Ventricular Rate:  69 PR Interval:  135 QRS Duration: 114 QT Interval:  422 QTC Calculation: 452 R Axis:   -126 Text Interpretation:  Sinus rhythm Borderline intraventricular conduction  delay Baseline wander in lead(s) III aVF No significant change since last  tracing Confirmed by Syvanna Ciolino  MD, Breshae Belcher ([id number]) on 01/12/2014 2:28:37 AM      MDM   Final diagnoses:  Chronic back pain  Alcohol intoxication, uncomplicated    Patient presents with back pain and appears intoxicated. Complaint back pain upon being arrested. He is nontoxic on exam. Vital signs are reassuring. He is slurring his words. No signs of cauda equina.  History of chronic pain. I have allowed the patient to take his home pain medication.  He reports improvement of pain. Basic lab work and EKG are reassuring. Patient was able to ambulate and by mouth challenge without difficulty. He will be discharged into police custody.  After history, exam, and medical workup I feel the patient has been appropriately medically screened and is safe for discharge home. Pertinent diagnoses were discussed with the patient. Patient was given return precautions.     Shon Batonourtney F Danella Philson, MD 01/12/14 [phone]

## 2014-01-12 NOTE — ED Notes (Signed)
Pt refusing to esign for discharge

## 2014-01-12 NOTE — ED Notes (Signed)
1 Opana ER 40 MG PO given from pt personal medication per Dr. Wilkie AyeHorton

## 2014-01-12 NOTE — Discharge Instructions (Signed)
Alcohol Intoxication Alcohol intoxication occurs when the amount of alcohol that a person has consumed impairs his or her ability to mentally and physically function. Alcohol directly impairs the normal chemical activity of the brain. Drinking large amounts of alcohol can lead to changes in mental function and behavior, and it can cause many physical effects that can be harmful.  Alcohol intoxication can range in severity from mild to very severe. Various factors can affect the level of intoxication that occurs, such as the person's age, gender, weight, frequency of alcohol consumption, and the presence of other medical conditions (such as diabetes, seizures, or heart conditions). Dangerous levels of alcohol intoxication may occur when people drink large amounts of alcohol in a short period (binge drinking). Alcohol can also be especially dangerous when combined with certain prescription medicines or "recreational" drugs. SIGNS AND SYMPTOMS Some common signs and symptoms of mild alcohol intoxication include:  Loss of coordination.  Changes in mood and behavior.  Impaired judgment.  Slurred speech. As alcohol intoxication progresses to more severe levels, other signs and symptoms will appear. These may include:  Vomiting.  Confusion and impaired memory.  Slowed breathing.  Seizures.  Loss of consciousness. DIAGNOSIS  Your health care provider will take a medical history and perform a physical exam. You will be asked about the amount and type of alcohol you have consumed. Blood tests will be done to measure the concentration of alcohol in your blood. In many places, your blood alcohol level must be lower than 80 mg/dL (9.56%0.08%) to legally drive. However, many dangerous effects of alcohol can occur at much lower levels.  TREATMENT  People with alcohol intoxication often do not require treatment. Most of the effects of alcohol intoxication are temporary, and they go away as the alcohol  naturally leaves the body. Your health care provider will monitor your condition until you are stable enough to go home. Fluids are sometimes given through an IV access tube to help prevent dehydration.  HOME CARE INSTRUCTIONS  Do not drive after drinking alcohol.  Stay hydrated. Drink enough water and fluids to keep your urine clear or pale yellow. Avoid caffeine.   Only take over-the-counter or prescription medicines as directed by your health care provider.  SEEK MEDICAL CARE IF:   You have persistent vomiting.   You do not feel better after a few days.  You have frequent alcohol intoxication. Your health care provider can help determine if you should see a substance use treatment counselor. SEEK IMMEDIATE MEDICAL CARE IF:   You become shaky or tremble when you try to stop drinking.   You shake uncontrollably (seizure).   You throw up (vomit) blood. This may be bright red or may look like black coffee grounds.   You have blood in your stool. This may be bright red or may appear as a black, tarry, bad smelling stool.   You become lightheaded or faint.  MAKE SURE YOU:   Understand these instructions.  Will watch your condition.  Will get help right away if you are not doing well or get worse. Document Released: 03/02/2005 Document Revised: 01/23/2013 Document Reviewed: 10/26/2012 [facility] Patient Information 2015 [place]. This information is not intended to replace advice given to you by your health care provider. Make sure you discuss any questions you have with your health care provider. Chronic Back Pain  When back pain lasts longer than 3 months, it is called chronic back pain.People with chronic back pain often go through certain periods  that are more intense (flare-ups).  CAUSES Chronic back pain can be caused by wear and tear (degeneration) on different structures in your back. These structures include:  The bones of your spine (vertebrae) and the  joints surrounding your spinal cord and nerve roots (facets).  The strong, fibrous tissues that connect your vertebrae (ligaments). Degeneration of these structures may result in pressure on your nerves. This can lead to constant pain. HOME CARE INSTRUCTIONS  Avoid bending, heavy lifting, prolonged sitting, and activities which make the problem worse.  Take brief periods of rest throughout the day to reduce your pain. Lying down or standing usually is better than sitting while you are resting.  Take over-the-counter or prescription medicines only as directed by your caregiver. SEEK IMMEDIATE MEDICAL CARE IF:   You have weakness or numbness in one of your legs or feet.  You have trouble controlling your bladder or bowels.  You have nausea, vomiting, abdominal pain, shortness of breath, or fainting. Document Released: 06/30/2004 Document Revised: 08/15/2011 Document Reviewed: 05/07/2011 [facility] Patient Information 2015 [place], [place]. This information is not intended to replace advice given to you by your health care provider. Make sure you discuss any questions you have with your health care provider.

## 2014-08-22 ENCOUNTER — Encounter (INDEPENDENT_AMBULATORY_CARE_PROVIDER_SITE_OTHER): Payer: Self-pay

## 2014-08-22 ENCOUNTER — Ambulatory Visit (INDEPENDENT_AMBULATORY_CARE_PROVIDER_SITE_OTHER): Payer: Medicaid Other | Admitting: Internal Medicine

## 2014-08-22 ENCOUNTER — Encounter: Payer: Self-pay | Admitting: Internal Medicine

## 2014-08-22 VITALS — BP 112/70 | HR 63 | Ht 68.5 in | Wt 165.0 lb

## 2014-08-22 DIAGNOSIS — J449 Chronic obstructive pulmonary disease, unspecified: Secondary | ICD-10-CM

## 2014-08-22 DIAGNOSIS — R911 Solitary pulmonary nodule: Secondary | ICD-10-CM

## 2014-08-22 DIAGNOSIS — Z720 Tobacco use: Secondary | ICD-10-CM | POA: Diagnosis not present

## 2014-08-22 DIAGNOSIS — F17210 Nicotine dependence, cigarettes, uncomplicated: Secondary | ICD-10-CM

## 2014-08-22 MED ORDER — MOMETASONE FURO-FORMOTEROL FUM 200-5 MCG/ACT IN AERO: INHALATION_SPRAY | RESPIRATORY_TRACT | Status: DC

## 2014-08-22 NOTE — Progress Notes (Signed)
Subjective:    Patient ID: Jonathan Smith, male    DOB: 05/04/1956   MRN: [id number]    Brief patient profile:  [age] yowm active smoker with chronic back pain since 1993 with "lung pain" since 2008 comes and goes and assoc with difficulty breathing started around 2012 gradually worse daily referred 09/28/12  by edp for SPN/ cp felt to be most c/w IBS   History of Present Illness  09/28/2012 1st pulmonary ov cc L Chest wall below nipple worse x 6 months on 02 at hs by [place]. Walk x 50 ft and can't walk block s back pain limiting him. No obvious daytime variabilty or assoc chronic cough or cp or chest tightness, subjective wheeze overt sinus or hb symptoms. No unusual exp hx or h/o childhood pna/ asthma or premature birth to his knowledge.  CP comes in waves, always in sitting position, some relieved with belching, not really pleuritic quality, some times radiates to L flank, dissipates in supine position. rec Classic subdiaphragmatic pain pattern suggests ibs:    rec mylanta II,  [place] x or Maalox plus are good to use as needed    08/22/2014 f/u ov/Jonathan Smith re: spn/ ab  Chief Complaint  Patient presents with  . Follow-up    Pt last seen in April 2014 for pulmonary nodule- we contacted him in Oct 2014 for 6 month f/u ct chest but he did not come in for scan due to "too much stuff going on".  [facility] and nodule is now larger. He c/o increased SOB "hard to say when b/c I don't do alot".      Using multiple forms of saba/ still smoking   No obvious day to day or daytime variabilty or assoc  cp or  overt sinus or hb symptoms. No unusual exp hx or h/o childhood pna/ asthma or knowledge of premature birth.  Sleeping ok without nocturnal  or early am exacerbation  of respiratory  c/o's or need for noct saba. Also denies any obvious fluctuation of symptoms with weather or environmental changes or other aggravating or alleviating factors except as outlined above   Current  Medications, Allergies, Complete Past Medical History, Past Surgical History, Family History, and Social History were reviewed in [facility] electronic medical record.  ROS  The following are not active complaints unless bolded sore throat, dysphagia, dental problems, itching, sneezing,  nasal congestion or excess/ purulent secretions, ear ache,   fever, chills, sweats, unintended wt loss, pleuritic or exertional cp, hemoptysis,  orthopnea pnd or leg swelling, presyncope, palpitations, heartburn, abdominal pain, anorexia, nausea, vomiting, diarrhea  or change in bowel or urinary habits, change in stools or urine, dysuria,hematuria,  rash, arthralgias, visual complaints, headache, numbness weakness or ataxia or problems with walking or coordination,  change in mood/affect or memory.                Objective:   Physical Exam   Extremely anxious, restless wm who failed to answer a single question asked in a straightforward manner, tending to go off on tangents or answer questions with ambiguous medical terms or diagnoses and seemed aggravated  when asked the same question more than once for clarification.    Wt Readings from Last 3 Encounters:  08/22/14 165 lb (74.844 kg)  05/18/13 156 lb (70.761 kg)  09/28/12 176 lb (79.833 kg)    Vital signs reviewed    HEENT: nl dentition, turbinates, and orophanx. Nl external ear canals  without cough reflex   NECK :  without JVD/Nodes/TM/ nl carotid upstrokes bilaterally   LUNGS: no acc muscle use, insp and exp rhonchi bilaterally p saba w/in 4h   CV:  RRR  no s3 or murmur or increase in P2, no edema   ABD:  soft and nontender with nl excursion in the supine position. No bruits or organomegaly, bowel sounds nl  MS:  warm without deformities, calf tenderness, cyanosis or clubbing  SKIN: warm and dry without lesions    NEURO:  alert, approp, no deficits   CT chest 09/19/13 1. No acute cardiopulmonary disease.  2. Left lower lobe  basilar ground-glass attenuation pulmonary  nodule measuring 8 mm. Initial follow-up by chest CT without  contrast is recommended in 3 months to confirm persistence No effusion.  Pulmonary review:  No effusion and the gg density is not at the pleural surface located posterior laterally  CT [facility] 07/23/14  9 mm LLL nodule        Assessment & Plan:

## 2014-08-22 NOTE — Patient Instructions (Addendum)
The key is to stop smoking completely before smoking completely stops you!   We will contact you in 6 months for f/u ct at our scanner 01/21/15 (6 months from previous)  Start dulera 200 Take 2 puffs first thing in am and then another 2 puffs about 12 hours later.   Work on inhaler technique:  relax and gently blow all the way out then take a nice smooth deep breath back in, triggering the inhaler at same time you start breathing in.  Hold for up to 5 seconds if you can.  Rinse and gargle with water when done     Return in needing more than a few times a day albuterol

## 2014-08-23 NOTE — Assessment & Plan Note (Signed)
-   baseline 09/19/12 x 8mm LLL vs 9 mm 07/23/14  - in tickle file for recall 01/21/15   With lesions this small a 1 mm change x 2 years is not significant growth  CT results reviewed with pt >>> Too small for PET or bx, not suspicious enough for excisional bx > really only option for now is follow the Fleischner society guidelines as rec by radiology > 6 m and return to our scanner for apples to apples slices

## 2014-08-23 NOTE — Assessment & Plan Note (Signed)

## 2014-08-23 NOTE — Assessment & Plan Note (Signed)
    DDX of  difficult airways management all start with A and  include Adherence, Ace Inhibitors, Acid Reflux, Active Sinus Disease, Alpha 1 Antitripsin deficiency, Anxiety masquerading as Airways dz,  ABPA,  allergy(esp in young), Aspiration (esp in elderly), Adverse effects of DPI,  Active smokers, plus two Bs  = Bronchiectasis and Beta blocker use..and one C= CHF  In this case Adherence is the biggest issue and starts with  inability to use HFA effectively and also  understand that SABA treats the symptoms but doesn't get to the underlying problem (inflammation).  I used  the analogy of putting steroid cream on a rash to help explain the meaning of topical therapy and the need to get the drug to the target tissue.    The proper method of use, as well as anticipated side effects, of a metered-dose inhaler are discussed and demonstrated to the patient. Improved effectiveness after extensive coaching during this visit to a level of approximately  75% so try dulera 200 2bid  Active smoking the other big concern > see sep a/p

## 2014-12-04 ENCOUNTER — Other Ambulatory Visit: Payer: Self-pay | Admitting: *Deleted

## 2014-12-04 DIAGNOSIS — R911 Solitary pulmonary nodule: Secondary | ICD-10-CM

## 2015-01-20 ENCOUNTER — Emergency Department (HOSPITAL_COMMUNITY)
Admission: EM | Admit: 2015-01-20 | Discharge: 2015-01-20 | Disposition: A | Discharge status: Home / Self Care | Payer: Medicaid Other | Attending: Emergency Medicine | Admitting: Emergency Medicine

## 2015-01-20 ENCOUNTER — Emergency Department (HOSPITAL_COMMUNITY): Payer: Medicaid Other

## 2015-01-20 ENCOUNTER — Encounter (HOSPITAL_COMMUNITY): Payer: Self-pay

## 2015-01-20 DIAGNOSIS — Z720 Tobacco use: Secondary | ICD-10-CM | POA: Insufficient documentation

## 2015-01-20 DIAGNOSIS — Y998 Other external cause status: Secondary | ICD-10-CM | POA: Insufficient documentation

## 2015-01-20 DIAGNOSIS — J449 Chronic obstructive pulmonary disease, unspecified: Secondary | ICD-10-CM | POA: Insufficient documentation

## 2015-01-20 DIAGNOSIS — F419 Anxiety disorder, unspecified: Secondary | ICD-10-CM | POA: Insufficient documentation

## 2015-01-20 DIAGNOSIS — Z79899 Other long term (current) drug therapy: Secondary | ICD-10-CM | POA: Insufficient documentation

## 2015-01-20 DIAGNOSIS — G8929 Other chronic pain: Secondary | ICD-10-CM | POA: Insufficient documentation

## 2015-01-20 DIAGNOSIS — Y9389 Activity, other specified: Secondary | ICD-10-CM | POA: Insufficient documentation

## 2015-01-20 DIAGNOSIS — S61012A Laceration without foreign body of left thumb without damage to nail, initial encounter: Secondary | ICD-10-CM | POA: Diagnosis not present

## 2015-01-20 DIAGNOSIS — Y9289 Other specified places as the place of occurrence of the external cause: Secondary | ICD-10-CM | POA: Diagnosis not present

## 2015-01-20 DIAGNOSIS — W540XXA Bitten by dog, initial encounter: Secondary | ICD-10-CM | POA: Insufficient documentation

## 2015-01-20 DIAGNOSIS — S61052A Open bite of left thumb without damage to nail, initial encounter: Secondary | ICD-10-CM | POA: Diagnosis present

## 2015-01-20 MED ORDER — ALPRAZOLAM 0.25 MG PO TABS
0.5000 mg | ORAL_TABLET | Freq: Once | ORAL | Status: AC
  Administered 2015-01-20: 0.5 mg via ORAL
  Filled 2015-01-20: qty 2

## 2015-01-20 MED ORDER — AMOXICILLIN-POT CLAVULANATE 875-125 MG PO TABS
1.0000 | ORAL_TABLET | Freq: Once | ORAL | Status: AC
Start: 2015-01-20 — End: 2015-01-20
  Administered 2015-01-20: 1 via ORAL
  Filled 2015-01-20: qty 1

## 2015-01-20 MED ORDER — AMOXICILLIN-POT CLAVULANATE 875-125 MG PO TABS: 1.0000 | ORAL_TABLET | Freq: Two times a day (BID) | ORAL | Status: DC

## 2015-01-20 MED ORDER — KETOROLAC TROMETHAMINE 60 MG/2ML IM SOLN
60.0000 mg | Freq: Once | INTRAMUSCULAR | Status: AC
  Administered 2015-01-20: 60 mg via INTRAMUSCULAR
  Filled 2015-01-20: qty 2

## 2015-01-20 MED ORDER — IBUPROFEN 600 MG PO TABS: 600.0000 mg | ORAL_TABLET | Freq: Four times a day (QID) | ORAL | Status: DC | PRN

## 2015-01-20 NOTE — Discharge Instructions (Signed)
Please follow up with your primary care physician in 1-2 days. If you do not have one please call the [facility] and wellness Center number listed above. Please follow up with Dr. Amanda Pea as needed to schedule a follow up appointment. Please call your family doctor or a counselor from list below to discuss your the next prescription. Please take your antibiotic until completion.  Please read all discharge instructions and return precautions.    Animal Bite An animal bite can result in a scratch on the skin, deep open cut, puncture of the skin, crush injury, or tearing away of the skin or a body part. Dogs are responsible for most animal bites. Children are bitten more often than adults. An animal bite can range from very mild to more serious. A small bite from your house pet is no cause for alarm. However, some animal bites can become infected or injure a bone or other tissue. You must seek medical care if:  The skin is broken and bleeding does not slow down or stop after 15 minutes.  The puncture is deep and difficult to clean (such as a cat bite).  Pain, warmth, redness, or pus develops around the wound.  The bite is from a stray animal or rodent. There may be a risk of rabies infection.  The bite is from a snake, raccoon, skunk, fox, coyote, or bat. There may be a risk of rabies infection.  The person bitten has a chronic illness such as diabetes, liver disease, or cancer, or the person takes medicine that lowers the immune system.  There is concern about the location and severity of the bite. It is important to clean and protect an animal bite wound right away to prevent infection. Follow these steps:  Clean the wound with plenty of water and soap.  Apply an antibiotic cream.  Apply gentle pressure over the wound with a clean towel or gauze to slow or stop bleeding.  Elevate the affected area above the heart to help stop any bleeding.  Seek medical care. Getting medical care within  8 hours of the animal bite leads to the best possible outcome. DIAGNOSIS  Your caregiver will most likely:  Take a detailed history of the animal and the bite injury.  Perform a wound exam.  Take your medical history. Blood tests or X-rays may be performed. Sometimes, infected bite wounds are cultured and sent to a lab to identify the infectious bacteria.  TREATMENT  Medical treatment will depend on the location and type of animal bite as well as the patient's medical history. Treatment may include:  Wound care, such as cleaning and flushing the wound with saline solution, bandaging, and elevating the affected area.  Antibiotics.  Tetanus immunization.  Rabies immunization.  Leaving the wound open to heal. This is often done with animal bites, due to the high risk of infection. However, in certain cases, wound closure with stitches, wound adhesive, skin adhesive strips, or staples may be used. Infected bites that are left untreated may require intravenous (IV) antibiotics and surgical treatment in the hospital. HOME CARE INSTRUCTIONS  Follow your caregiver's instructions for wound care.  Take all medicines as directed.  If your caregiver prescribes antibiotics, take them as directed. Finish them even if you start to feel better.  Follow up with your caregiver for further exams or immunizations as directed. You may need a tetanus shot if:  You cannot remember when you had your last tetanus shot.  You have never had  a tetanus shot.  The injury broke your skin. If you get a tetanus shot, your arm may swell, get red, and feel warm to the touch. This is common and not a problem. If you need a tetanus shot and you choose not to have one, there is a rare chance of getting tetanus. Sickness from tetanus can be serious. SEEK MEDICAL CARE IF:  You notice warmth, redness, soreness, swelling, pus discharge, or a bad smell coming from the wound.  You have a red line on the skin coming  from the wound.  You have a fever, chills, or a general ill feeling.  You have nausea or vomiting.  You have continued or worsening pain.  You have trouble moving the injured part.  You have other questions or concerns. MAKE SURE YOU:  Understand these instructions.  Will watch your condition.  Will get help right away if you are not doing well or get worse. Document Released: 02/08/2011 Document Revised: 08/15/2011 Document Reviewed: 02/08/2011 [facility] Patient Information 2015 [place], [place]. This information is not intended to replace advice given to you by your health care provider. Make sure you discuss any questions you have with your health care provider.  If you do not have a primary care doctor to follow up with regarding today's visit, please call the [facility] at [phone] to make an appointment. Hours of operation are 10am - 7pm, Monday through Friday, and they have a sliding scale fee.     RESOURCE GUIDE  Emergency Shelter:  [facility] [phone]   Intensive Outpatient Programs: [facility]      [address] [place], [place] [phone] Both a day and evening program       [facility] Outpatient     [address]        [place], [place] [address] [phone]         ADS: [facility] [address] [place] [phone]  [facility] Mental Health ACCESS LINE: [phone] or [phone] [address] [place], [place] [address] [email or web address]   Substance Abuse Resources: - [facility] Drug [facility]  [phone] - [facility] [phone] - The [place] [phone] Floydene Flock [phone] - Residential & Outpatient Substance Abuse Program  [phone]  Psychological Services: [facility]  [phone] [facility]  [phone] - [facility], [phone] [place]. [address], [place], ACCESS LINE: [phone] or ([phone], [email or web address]  Mobile Crisis Teams:                                        [facility]         Mobile Crisis Care Unit [phone]             [facility] [address] Ginette Otto [phone]                                         [facility] [address] [place] [phone]  Self-Help/Support Groups: [facility] Health [facility]oc[facility] of [employer] of support groups 813-645-7334 (call for more info)  Narcotics Anonymous (NA) [facility] [address] [place] - 2 meetings at this location  [employer]:  [facility]      [address]        [place]       [phone]         [facility] [address], [place] [address] [place], [place]  [address] [phone]  [facility]  [address] [place], [place] [address] [phone] Admissions: 8am-3pm M-F  [facility]     [address]        [place], [place] [address]       [phone]         [facility] [address] [place], [place] [phone]  [facility] [address] [place], [place] [phone]  [facility] - Intensive Outpatient      [address]     [place], [place]       [phone]         [facility]     [address] [place], [place] [phone] or [phone]  [facility] ([facility]), Medicaid [address] [place], [place] [phone]  [facility] [address]                                               [address] [place] [phone]  [facility] Resources: [place] [phone]               General Therapy                                                  Angie Fava, PhD        [address] [place], [place] [address]         [phone]   Insurance  [facility]   [address] [place], [place] [address] [phone]  [facility] [address]  [place], [place] [address] [phone] Insurance/Medicaid/sponsorship through [facility] and Families                                              [address]                                        [place], [place] [address]    Therapy/tele-psych/case         [phone]          [facility] [address], [place]  [address]  Adolescent/group home/case management [phone]                                           Creola Corn PhD  General therapy       Insurance   [phone]         Dr. Adele Schilder, Insurance, M-F [phone]

## 2015-01-20 NOTE — ED Notes (Signed)
J. PIEPENBRINK at bedside

## 2015-01-20 NOTE — ED Provider Notes (Signed)
CSN: [id number]     Arrival date & time 01/20/15  0605 History   First MD Initiated Contact with Patient 01/20/15 [phone]     Chief Complaint  Patient presents with  . Animal Bite     (Consider location/radiation/quality/duration/timing/severity/associated sxs/prior Treatment) HPI Comments: Pt arrived by POV c/o dog bite to left thumb. Pt states dog bite happened around 11pm, the dog lives at the residence he lives at, dog is owned by the owners son. Pt states dog is up to date on rabies and vaccines. Pt is very concerned about prescriptions for xanax and states he hasn't been able to sleep for a week. Vaccinations UTD for age. R hand dominant.    Patient is a 59 y.o. male presenting with animal bite. The history is provided by the patient.  Animal Bite Contact animal:  Dog Location:  Finger Finger injury location:  L thumb Time since incident:  8 hours Pain details:    Severity:  Severe   Timing:  Constant   Progression:  Unchanged Incident location:  Home Animal's rabies vaccination status:  Up to date Animal in possession: yes   Tetanus status:  Up to date Relieved by:  Nothing Worsened by:  Nothing tried Ineffective treatments: Opana. Associated symptoms: no fever, no numbness, no rash and no swelling     Past Medical History  Diagnosis Date  . Lung nodule   . COPD (chronic obstructive pulmonary disease)   . Chronic back pain    Past Surgical History  Procedure Laterality Date  . Cervical spine surgery    . Appendectomy     Family History  Problem Relation Age of Onset  . Emphysema Paternal Grandfather     smoked   Social History  Substance Use Topics  . Smoking status: Current Every Day Smoker -- 1.00 packs/day for 30 years    Types: Cigarettes  . Smokeless tobacco: Never Used  . Alcohol Use: Yes     Comment: occasional    Review of Systems  Constitutional: Negative for fever.  Skin: Negative for rash.       + laceration L thumb  Neurological:  Negative for numbness.      Allergies  Review of patient's allergies indicates no known allergies.  Home Medications   Prior to Admission medications   Medication Sig Start Date End Date Taking? Authorizing Provider  albuterol (PROVENTIL HFA;VENTOLIN HFA) 108 (90 BASE) MCG/ACT inhaler Inhale 1-2 puffs into the lungs every 4 (four) hours as needed for wheezing. 09/19/12  Yes Richardean Canal, MD  gabapentin (NEURONTIN) 100 MG capsule Take 100 mg by mouth 3 (three) times daily. 01/05/15  Yes Historical Provider, MD  mometasone-formoterol (DULERA) 200-5 MCG/ACT AERO Take 2 puffs first thing in am and then another 2 puffs about 12 hours later. 08/22/14  Yes Nyoka Cowden, MD  oxymorphone (OPANA ER) 40 MG 12 hr tablet Take 40 mg by mouth 2 (two) times daily.   Yes Historical Provider, MD  oxymorphone (OPANA) 10 MG tablet Take 10 mg by mouth 2 (two) times daily as needed for pain.   Yes Historical Provider, MD  tiZANidine (ZANAFLEX) 4 MG capsule Take 4 mg by mouth 2 (two) times daily.   Yes Historical Provider, MD  zolpidem (AMBIEN) 10 MG tablet Take 10 mg by mouth at bedtime.   Yes Historical Provider, MD  amoxicillin-clavulanate (AUGMENTIN) 875-125 MG per tablet Take 1 tablet by mouth every 12 (twelve) hours. 01/20/15   Ona Rathert, PA-C  ibuprofen (  ADVIL,MOTRIN) 600 MG tablet Take 1 tablet (600 mg total) by mouth every 6 (six) hours as needed. 01/20/15   Trayvion Embleton, PA-C   BP 119/72 mmHg  Pulse 65  Temp(Src) 98.1 F (36.7 C) (Oral)  Resp 18  Ht 5\' 9"  (1.753 m)  Wt 167 lb (75.751 kg)  BMI 24.65 kg/m2  SpO2 98% Physical Exam  Constitutional: He is oriented to person, place, and time. He appears well-developed and well-nourished. No distress.  HENT:  Head: Normocephalic and atraumatic.  Right Ear: External ear normal.  Left Ear: External ear normal.  Nose: Nose normal.  Mouth/Throat: Oropharynx is clear and moist.  Eyes: Conjunctivae are normal.  Neck: Normal range of  motion. Neck supple.  No nuchal rigidity.   Cardiovascular: Normal rate, regular rhythm, normal heart sounds and intact distal pulses.   Pulmonary/Chest: Effort normal.  Abdominal: Soft.  Musculoskeletal: Normal range of motion.       Left wrist: Normal.       Left hand: He exhibits tenderness (l thumb) and laceration (1 cm superficial laceration dorsal surface. Bleeding controlled. ). He exhibits normal range of motion, normal capillary refill and no swelling. Normal sensation noted. Normal strength noted.  Neurological: He is alert and oriented to person, place, and time.  Skin: Skin is warm and dry. He is not diaphoretic.  Psychiatric: His mood appears anxious.  Nursing note and vitals reviewed.   ED Course  Procedures (including critical care time) Medications  ketorolac (TORADOL) injection 60 mg (60 mg Intramuscular Given 01/20/15 0645)  amoxicillin-clavulanate (AUGMENTIN) 875-125 MG per tablet 1 tablet (1 tablet Oral Given 01/20/15 0645)  ALPRAZolam Prudy Feeler) tablet 0.5 mg (0.5 mg Oral Given 01/20/15 0644)    Labs Review Labs Reviewed - No data to display  Imaging Review Dg Finger Thumb Left  01/20/2015   CLINICAL DATA:  Dog bite to the left thumb.  Laceration at IP joint.  EXAM: LEFT THUMB 2+V  COMPARISON:  None.  FINDINGS: Negative for fracture or dislocation. Alignment of the thumb is normal. No evidence for a radiopaque foreign body. There is a bandage on the thumb.  IMPRESSION: No acute bone abnormality.  No evidence for a radiopaque foreign body.   Electronically Signed   By: Richarda Overlie M.D.   On: 01/20/2015 07:09   I, Jahmere Bramel L, personally reviewed and evaluated these images and lab results as part of my medical decision-making.   EKG Interpretation None      MDM   Final diagnoses:  Dog bite of thumb, left, initial encounter    Filed Vitals:   01/20/15 0719  BP: 119/72  Pulse: 65  Temp:   Resp: 18   Physical exam is otherwise unremarkable from  laceration. Tdap UTD. Wound cleaning complete with pressure irrigation, bottom of wound visualized, no foreign bodies appreciated. Laceration was obtained from a dog bite. Laceration is superficial, bleeding controlled no evidence of tendon involvement. Will not close given mechanism of injury. Will cover with clean dry sterile dressing.. Pt has no co morbidities to effect normal wound healing. Discussed wound home care w parent/guardian and answered questions. Pt to f-u for wound recheck in 2-3 days days. Will discharge home on Augmentin. Advised to take at home chronic pain medication or discuss further needs with prescribing provider. Also advise discussion with prescribing doctor about further Xanax prescriptions. Return precautions discussed. Parent agreeable to plan. Pt is hemodynamically stable w no complaints prior to dc.  Francee Piccolo, PA-C 01/20/15 1006  Azalia Bilis, MD 01/20/15 [phone]

## 2015-01-20 NOTE — ED Notes (Signed)
Pt arrived by POV c/o dog bite to left thumb.  Pt states dog bite happened around 11pm, the dog lives at the residence he lives at, dog is owned by the owners son.  Pt states dog is up to date on rabies and vaccines.  Pt is very concerned about prescriptions for xanax and states he hasn't been able to sleep for a week.

## 2015-01-21 ENCOUNTER — Emergency Department (HOSPITAL_COMMUNITY)
Admission: EM | Admit: 2015-01-21 | Discharge: 2015-01-21 | Discharge status: Absent without leave | Payer: Medicaid Other

## 2015-04-01 ENCOUNTER — Encounter (HOSPITAL_COMMUNITY): Payer: Self-pay | Admitting: Emergency Medicine

## 2015-04-01 ENCOUNTER — Emergency Department (HOSPITAL_COMMUNITY): Payer: Medicaid Other

## 2015-04-01 ENCOUNTER — Emergency Department (HOSPITAL_COMMUNITY)
Admission: EM | Admit: 2015-04-01 | Discharge: 2015-04-01 | Disposition: A | Discharge status: Home / Self Care | Payer: Medicaid Other | Attending: Emergency Medicine | Admitting: Emergency Medicine

## 2015-04-01 DIAGNOSIS — M48061 Spinal stenosis, lumbar region without neurogenic claudication: Secondary | ICD-10-CM

## 2015-04-01 DIAGNOSIS — Z720 Tobacco use: Secondary | ICD-10-CM | POA: Insufficient documentation

## 2015-04-01 DIAGNOSIS — Z87828 Personal history of other (healed) physical injury and trauma: Secondary | ICD-10-CM | POA: Diagnosis not present

## 2015-04-01 DIAGNOSIS — Z79899 Other long term (current) drug therapy: Secondary | ICD-10-CM | POA: Diagnosis not present

## 2015-04-01 DIAGNOSIS — M9983 Other biomechanical lesions of lumbar region: Secondary | ICD-10-CM

## 2015-04-01 DIAGNOSIS — G8929 Other chronic pain: Secondary | ICD-10-CM | POA: Insufficient documentation

## 2015-04-01 DIAGNOSIS — M545 Low back pain: Secondary | ICD-10-CM | POA: Insufficient documentation

## 2015-04-01 DIAGNOSIS — M4806 Spinal stenosis, lumbar region: Secondary | ICD-10-CM | POA: Diagnosis not present

## 2015-04-01 DIAGNOSIS — J449 Chronic obstructive pulmonary disease, unspecified: Secondary | ICD-10-CM | POA: Diagnosis not present

## 2015-04-01 MED ORDER — METHOCARBAMOL 1000 MG/10ML IJ SOLN
1000.0000 mg | Freq: Once | INTRAVENOUS | Status: AC
  Administered 2015-04-01: 1000 mg via INTRAVENOUS
  Filled 2015-04-01: qty 10

## 2015-04-01 MED ORDER — OXYCODONE HCL 5 MG PO TABS
10.0000 mg | ORAL_TABLET | Freq: Once | ORAL | Status: AC
  Administered 2015-04-01: 10 mg via ORAL
  Filled 2015-04-01: qty 2

## 2015-04-01 MED ORDER — FENTANYL CITRATE (PF) 100 MCG/2ML IJ SOLN
50.0000 ug | Freq: Once | INTRAMUSCULAR | Status: AC
  Administered 2015-04-01: 50 ug via INTRAVENOUS
  Filled 2015-04-01: qty 2

## 2015-04-01 MED ORDER — FENTANYL CITRATE (PF) 100 MCG/2ML IJ SOLN
100.0000 ug | Freq: Once | INTRAMUSCULAR | Status: AC
  Administered 2015-04-01: 100 ug via INTRAVENOUS
  Filled 2015-04-01: qty 2

## 2015-04-01 MED ORDER — KETOROLAC TROMETHAMINE 30 MG/ML IJ SOLN
30.0000 mg | Freq: Once | INTRAMUSCULAR | Status: AC
  Administered 2015-04-01: 30 mg via INTRAVENOUS
  Filled 2015-04-01: qty 1

## 2015-04-01 MED ORDER — METHOCARBAMOL 1000 MG/10ML IJ SOLN: 1000.0000 mg | Freq: Once | INTRAMUSCULAR | Status: DC

## 2015-04-01 NOTE — ED Notes (Signed)
Provided patient with cup of water per MD 

## 2015-04-01 NOTE — ED Provider Notes (Signed)
CSN: [id number]     Arrival date & time 04/01/15  0321 History   First MD Initiated Contact with Patient 04/01/15 [phone]     Chief Complaint  Patient presents with  . Back Pain     (Consider location/radiation/quality/duration/timing/severity/associated sxs/prior Treatment) Patient is a 59 y.o. male presenting with back pain. The history is provided by the patient.  Back Pain He has chronic back pain for which he takes Opana twice a day. 11 days ago, he tripped and fell and landed on his back. Since then he has been having increased pain in his back. He has chronic numbness in his left thigh but states that he is now having numbness in his right thigh. Denies any bowel or bladder dysfunction. He states that he has not been able to walk well since then and has had multiple falls since then. Opana is not giving him adequate relief of pain. He rates his pain a 10/10. He saw his orthopedic doctor who did plain lumbar x-rays and has scheduled him for an MRI next week. He comes in because of inability to control pain at home. Pain is worse with any movement. Nothing makes it any better.  Past Medical History  Diagnosis Date  . Lung nodule   . COPD (chronic obstructive pulmonary disease) (HCC)   . Chronic back pain    Past Surgical History  Procedure Laterality Date  . Cervical spine surgery    . Appendectomy     Family History  Problem Relation Age of Onset  . Emphysema Paternal Grandfather     smoked   Social History  Substance Use Topics  . Smoking status: Current Every Day Smoker -- 0.50 packs/day for 30 years    Types: Cigarettes  . Smokeless tobacco: Never Used  . Alcohol Use: Yes     Comment: occasional, "i had some today 04/01/15, because it was emergency"    Review of Systems  Musculoskeletal: Positive for back pain.  All other systems reviewed and are negative.     Allergies  Review of patient's allergies indicates no known allergies.  Home Medications   Prior to  Admission medications   Medication Sig Start Date End Date Taking? Authorizing Provider  albuterol (PROVENTIL HFA;VENTOLIN HFA) 108 (90 BASE) MCG/ACT inhaler Inhale 1-2 puffs into the lungs every 4 (four) hours as needed for wheezing. 09/19/12   Richardean Canal, MD  amoxicillin-clavulanate (AUGMENTIN) 875-125 MG per tablet Take 1 tablet by mouth every 12 (twelve) hours. 01/20/15   Jennifer Piepenbrink, PA-C  gabapentin (NEURONTIN) 100 MG capsule Take 100 mg by mouth 3 (three) times daily. 01/05/15   Historical Provider, MD  ibuprofen (ADVIL,MOTRIN) 600 MG tablet Take 1 tablet (600 mg total) by mouth every 6 (six) hours as needed. 01/20/15   Jennifer Piepenbrink, PA-C  mometasone-formoterol (DULERA) 200-5 MCG/ACT AERO Take 2 puffs first thing in am and then another 2 puffs about 12 hours later. 08/22/14   Nyoka Cowden, MD  oxymorphone (OPANA ER) 40 MG 12 hr tablet Take 40 mg by mouth 2 (two) times daily.    Historical Provider, MD  oxymorphone (OPANA) 10 MG tablet Take 10 mg by mouth 2 (two) times daily as needed for pain.    Historical Provider, MD  tiZANidine (ZANAFLEX) 4 MG capsule Take 4 mg by mouth 2 (two) times daily.    Historical Provider, MD  zolpidem (AMBIEN) 10 MG tablet Take 10 mg by mouth at bedtime.    Historical Provider, MD  BP 138/81 mmHg  Pulse 62  Temp(Src) 98.1 F (36.7 C) (Oral)  Resp 18  Ht 5\' 8"  (1.727 m)  Wt 163 lb (73.936 kg)  BMI 24.79 kg/m2  SpO2 100% Physical Exam  Nursing note and vitals reviewed.  59 year old male, resting comfortably and in no acute distress. Vital signs are normal. Oxygen saturation is 100%, which is normal. Head is normocephalic and atraumatic. PERRLA, EOMI. Oropharynx is clear. Neck is nontender and supple without adenopathy or JVD. Back is tender in the lower lumbar area. There is no CVA tenderness. Lungs are clear without rales, wheezes, or rhonchi. Chest is nontender. Heart has regular rate and rhythm without murmur. Abdomen is soft, flat,  nontender without masses or hepatosplenomegaly and peristalsis is normoactive. Extremities have no cyanosis or edema, full range of motion is present. Skin is warm and dry without rash. Neurologic: Mental status is normal, cranial nerves are intact, there are no motor or sensory deficits.  ED Course  Procedures (including critical care time)  Imaging Review Mr Lumbar Spine Wo Contrast  04/01/2015  CLINICAL DATA:  Back pain after fall October 15th while running with his dog. Severe back pain rating to bilateral lower extremities. EXAM: MRI LUMBAR SPINE WITHOUT CONTRAST TECHNIQUE: Multiplanar, multisequence MR imaging of the lumbar spine was performed. No intravenous contrast was administered. COMPARISON:  CT abdomen and pelvis February 07, 2008 FINDINGS: Lumbar vertebral bodies are intact and aligned, maintenance of lumbar lordosis. Using the reference level of the last well-formed intervertebral disc as L5-S1, severe L5-S1 disc height loss, moderate at L2-3 and mild at L3-4 with decreased T2 signal within the lumbar disc consistent with mild desiccation. Moderate acute on chronic discogenic endplate change at L5-S1. Mild to moderate subacute to chronic discogenic endplate changes L1-2 through L4-5. Scattered chronic Schmorl's nodes. No STIR signal abnormality to suggest fracture. Included view of the thoracic spine demonstrates T11 and T12 pedicles screws. Conus medullaris terminates at L1 and appears normal in morphology and signal characteristics. Cauda equina is normal in appearance. Included prevertebral paraspinal soft tissues are normal. Level by level evaluation: T12-L1: Small LEFT central disc protrusion and annular fissure, very mild canal stenosis without neural foraminal narrowing. L1-2: Annular bulging, no canal stenosis or neural foraminal narrowing. Small broad-based disc bulge, very mild canal stenosis without neural foraminal narrowing. L3-4: Very small broad-based disc bulge, mild facet  arthropathy and ligamentum flavum redundancy without canal stenosis. Mild RIGHT neural foraminal narrowing. L4-5: Mild annular bulging, mild facet arthropathy and ligamentum flavum redundancy without canal stenosis. Mild bilateral neural foraminal narrowing. L5-S1: Moderate broad-based disc bulge asymmetric to the RIGHT contacts the exited RIGHT L5 nerve. Status post apparent bilateral remote L5 laminectomies. No canal stenosis. Moderate to severe RIGHT L5-S1 neural foraminal narrowing, the exiting RIGHT L5 nerve appears somewhat enlarged edematous. Moderate LEFT neural foraminal narrowing. IMPRESSION: No acute lumbar spine fracture malalignment. Degenerative change of the lumbar spine. Status post apparent bilateral L5 laminectomies. Very mild canal stenosis T12-L1 associated with small LEFT central disc protrusion and annular fissure. Neural foraminal narrowing L3-4 through L5-S1: Moderate to severe on the RIGHT L5-S1 with findings of RIGHT L5 neuritis. Electronically Signed   By: Awilda Metroourtnay  Bloomer M.D.   On: 04/01/2015 06:41   I have personally reviewed and evaluated these images and lab results as part of my medical decision-making.   MDM   Final diagnoses:  Chronic midline low back pain without sciatica    Exacerbation of chronic lumbar pain following fall. I'm  unable to access the x-rays done as an outpatient, but I feel certain that the orthopedist who ordered them would have recognized a fracture present. He will be given intravenous methocarbamol and ketorolac and he will be sent for MRI. Old records are reviewed confirming prior ED visit for chronic back pain.  Following above-noted treatment, he states that pain did improve. MRI shows moderate to severe neural foraminal narrowing at L5-S1 on the right with L5 neuritis. I've explained these findings the patient area he expresses concern that he cannot return to his home and he just moved to assisted living. Social work consult has been  requested.  Dione Booze, MD 04/01/15 [phone]

## 2015-04-01 NOTE — Discharge Instructions (Signed)
Use your home pain medication as directed by your regular doctor. Call your orthopedist today to make an appointment for ongoing management of your chronic pain. Use the list below to find a chronic pain management specialist. Return to the ER for changes or worsening symptoms.   Chronic Back Pain  When back pain lasts longer than 3 months, it is called chronic back pain.People with chronic back pain often go through certain periods that are more intense (flare-ups).  CAUSES Chronic back pain can be caused by wear and tear (degeneration) on different structures in your back. These structures include:  The bones of your spine (vertebrae) and the joints surrounding your spinal cord and nerve roots (facets).  The strong, fibrous tissues that connect your vertebrae (ligaments). Degeneration of these structures may result in pressure on your nerves. This can lead to constant pain. HOME CARE INSTRUCTIONS  Avoid bending, heavy lifting, prolonged sitting, and activities which make the problem worse.  Take brief periods of rest throughout the day to reduce your pain. Lying down or standing usually is better than sitting while you are resting.  Take over-the-counter or prescription medicines only as directed by your caregiver. SEEK IMMEDIATE MEDICAL CARE IF:   You have weakness or numbness in one of your legs or feet.  You have trouble controlling your bladder or bowels.  You have nausea, vomiting, abdominal pain, shortness of breath, or fainting.   This information is not intended to replace advice given to you by your health care provider. Make sure you discuss any questions you have with your health care provider.   Document Released: 06/30/2004 Document Revised: 08/15/2011 Document Reviewed: 11/10/2014 Elsevier Interactive Patient Education 2016 [place] Injury Prevention Back injuries can be very painful. They can also be difficult to heal. After having one back injury, you  are more likely to injure your back again. It is important to learn how to avoid injuring or re-injuring your back. The following tips can help you to prevent a back injury. WHAT SHOULD I KNOW ABOUT PHYSICAL FITNESS?  Exercise for 30 minutes per day on most days of the week or as told by your doctor. Make sure to:  Do aerobic exercises, such as walking, jogging, biking, or swimming.  Do exercises that increase balance and strength, such as tai chi and yoga.  Do stretching exercises. This helps with flexibility.  Try to develop strong belly (abdominal) muscles. Your belly muscles help to support your back.  Stay at a healthy weight. This helps to decrease your risk of a back injury. WHAT SHOULD I KNOW ABOUT MY DIET?  Talk with your doctor about your overall diet. Take supplements and vitamins only as told by your doctor.  Talk with your doctor about how much calcium and vitamin D you need each day. These nutrients help to prevent weakening of the bones (osteoporosis).  Include good sources of calcium in your diet, such as:  Dairy products.  Green leafy vegetables.  Products that have had calcium added to them (fortified).  Include good sources of vitamin D in your diet, such as:  Milk.  Foods that have had vitamin D added to them. WHAT SHOULD I KNOW ABOUT MY POSTURE?  Sit up straight and stand up straight. Avoid leaning forward when you sit or hunching over when you stand.  Choose chairs that have good low-back (lumbar) support.  If you work at a desk, sit close to it so you do not need to lean over.  Keep your chin tucked in. Keep your neck drawn back. Keep your elbows bent so your arms look like the letter "L" (right angle).  Sit high and close to the steering wheel when you drive. Add a low-back support to your car seat, if needed.  Avoid sitting or standing in one position for very long. Take breaks to get up, stretch, and walk around at least one time every hour. Take  breaks every hour if you are driving for long periods of time.  Sleep on your side with your knees slightly bent, or sleep on your back with a pillow under your knees. Do not lie on the front of your body to sleep. WHAT SHOULD I KNOW ABOUT LIFTING, TWISTING, AND REACHING Lifting and Heavy Lifting  Avoid heavy lifting, especially lifting over and over again. If you must do heavy lifting:  Stretch before lifting.  Work slowly.  Rest between lifts.  Use a tool such as a cart or a dolly to move objects if one is available.  Make several small trips instead of carrying one heavy load.  Ask for help when you need it, especially when moving big objects.  Follow these steps when lifting:  Stand with your feet shoulder-width apart.  Get as close to the object as you can. Do not pick up a heavy object that is far from your body.  Use handles or lifting straps if they are available.  Bend at your knees. Squat down, but keep your heels off the floor.  Keep your shoulders back. Keep your chin tucked in. Keep your back straight.  Lift the object slowly while you tighten the muscles in your legs, belly, and butt. Keep the object as close to the center of your body as possible.  Follow these steps when putting down a heavy load:  Stand with your feet shoulder-width apart.  Lower the object slowly while you tighten the muscles in your legs, belly, and butt. Keep the object as close to the center of your body as possible.  Keep your shoulders back. Keep your chin tucked in. Keep your back straight.  Bend at your knees. Squat down, but keep your heels off the floor.  Use handles or lifting straps if they are available. Twisting and Reaching 1. Avoid lifting heavy objects above your waist. 2. Do not twist at your waist while you are lifting or carrying a load. If you need to turn, move your feet. 3. Do not bend over without bending at your knees. 4. Avoid reaching over your head,  across a table, or for an object on a high surface.  WHAT ARE SOME OTHER TIPS? 1. Avoid wet floors and icy ground. Keep sidewalks clear of ice to prevent falls.  2. Do not sleep on a mattress that is too soft or too hard.  3. Keep items that you use often within easy reach.  4. Put heavier objects on shelves at waist level, and put lighter objects on lower or higher shelves. 5. Find ways to lower your stress, such as: 1. Exercise. 2. Massage. 3. Relaxation techniques. 6. Talk with your doctor if you feel anxious or depressed. These conditions can make back pain worse. 7. Wear flat heel shoes with cushioned soles. 8. Avoid making quick (sudden) movements. 9. Use both shoulder straps when carrying a backpack. 10. Do not use any tobacco products, including cigarettes, chewing tobacco, or electronic cigarettes. If you need help quitting, ask your doctor.   This information is not  intended to replace advice given to you by your health care provider. Make sure you discuss any questions you have with your health care provider.   Document Released: 11/09/2007 Document Revised: 10/07/2014 Document Reviewed: 05/27/2014 Elsevier Interactive Patient Education 2016 [place].  Back Exercises If you have pain in your back, do these exercises 2-3 times each day or as told by your doctor. When the pain goes away, do the exercises once each day, but repeat the steps more times for each exercise (do more repetitions). If you do not have pain in your back, do these exercises once each day or as told by your doctor. EXERCISES Single Knee to Chest Do these steps 3-5 times in a row for each leg:  Lie on your back on a firm bed or the floor with your legs stretched out.  Bring one knee to your chest.  Hold your knee to your chest by grabbing your knee or thigh.  Pull on your knee until you feel a gentle stretch in your lower back.  Keep doing the stretch for 10-30 seconds.  Slowly let go of  your leg and straighten it. Pelvic Tilt Do these steps 5-10 times in a row:  Lie on your back on a firm bed or the floor with your legs stretched out.  Bend your knees so they point up to the ceiling. Your feet should be flat on the floor.  Tighten your lower belly (abdomen) muscles to press your lower back against the floor. This will make your tailbone point up to the ceiling instead of pointing down to your feet or the floor.  Stay in this position for 5-10 seconds while you gently tighten your muscles and breathe evenly. Cat-Cow Do these steps until your lower back bends more easily:  Get on your hands and knees on a firm surface. Keep your hands under your shoulders, and keep your knees under your hips. You may put padding under your knees.  Let your head hang down, and make your tailbone point down to the floor so your lower back is round like the back of a cat.  Stay in this position for 5 seconds.  Slowly lift your head and make your tailbone point up to the ceiling so your back hangs low (sags) like the back of a cow.  Stay in this position for 5 seconds. Press-Ups Do these steps 5-10 times in a row:  Lie on your belly (face-down) on the floor.  Place your hands near your head, about shoulder-width apart.  While you keep your back relaxed and keep your hips on the floor, slowly straighten your arms to raise the top half of your body and lift your shoulders. Do not use your back muscles. To make yourself more comfortable, you may change where you place your hands.  Stay in this position for 5 seconds.  Slowly return to lying flat on the floor. Bridges Do these steps 10 times in a row: 5. Lie on your back on a firm surface. 6. Bend your knees so they point up to the ceiling. Your feet should be flat on the floor. 7. Tighten your butt muscles and lift your butt off of the floor until your waist is almost as high as your knees. If you do not feel the muscles working in  your butt and the back of your thighs, slide your feet 1-2 inches farther away from your butt. 8. Stay in this position for 3-5 seconds. 9. Slowly lower your  butt to the floor, and let your butt muscles relax. If this exercise is too easy, try doing it with your arms crossed over your chest. Belly Crunches Do these steps 5-10 times in a row: 11. Lie on your back on a firm bed or the floor with your legs stretched out. 12. Bend your knees so they point up to the ceiling. Your feet should be flat on the floor. 61. Cross your arms over your chest. 14. Tip your chin a little bit toward your chest but do not bend your neck. 22. Tighten your belly muscles and slowly raise your chest just enough to lift your shoulder blades a tiny bit off of the floor. 16. Slowly lower your chest and your head to the floor. Back Lifts Do these steps 5-10 times in a row: 1. Lie on your belly (face-down) with your arms at your sides, and rest your forehead on the floor. 2. Tighten the muscles in your legs and your butt. 3. Slowly lift your chest off of the floor while you keep your hips on the floor. Keep the back of your head in line with the curve in your back. Look at the floor while you do this. 4. Stay in this position for 3-5 seconds. 5. Slowly lower your chest and your face to the floor. GET HELP IF:  Your back pain gets a lot worse when you do an exercise.  Your back pain does not lessen 2 hours after you exercise. If you have any of these problems, stop doing the exercises. Do not do them again unless your doctor says it is okay. GET HELP RIGHT AWAY IF:  You have sudden, very bad back pain. If this happens, stop doing the exercises. Do not do them again unless your doctor says it is okay.   This information is not intended to replace advice given to you by your health care provider. Make sure you discuss any questions you have with your health care provider.   Document Released: 06/25/2010 Document  Revised: 02/11/2015 Document Reviewed: 07/17/2014 Elsevier Interactive Patient Education 2016 [place] therapy can help ease sore, stiff, injured, and tight muscles and joints. Heat relaxes your muscles, which may help ease your pain. Heat therapy should only be used on old, pre-existing, or long-lasting (chronic) injuries. Do not use heat therapy unless told by your doctor. HOW TO USE HEAT THERAPY There are several different kinds of heat therapy, including:  Moist heat pack.  Warm water bath.  Hot water bottle.  Electric heating pad.  Heated gel pack.  Heated wrap.  Electric heating pad. GENERAL HEAT THERAPY RECOMMENDATIONS   Do not sleep while using heat therapy. Only use heat therapy while you are awake.  Your skin may turn pink while using heat therapy. Do not use heat therapy if your skin turns red.  Do not use heat therapy if you have new pain.  High heat or long exposure to heat can cause burns. Be careful when using heat therapy to avoid burning your skin.  Do not use heat therapy on areas of your skin that are already irritated, such as with a rash or sunburn. GET HELP IF:   You have blisters, redness, swelling (puffiness), or numbness.  You have new pain.  Your pain is worse. MAKE SURE YOU:  Understand these instructions.  Will watch your condition.  Will get help right away if you are not doing well or get worse.   This information  is not intended to replace advice given to you by your health care provider. Make sure you discuss any questions you have with your health care provider.   Document Released: 08/15/2011 Document Revised: 06/13/2014 Document Reviewed: 07/16/2013 Elsevier Interactive Patient Education 2016 [employer].  Emergency Department Resource Guide 1) Find a Doctor and Pay Out of Pocket Although you won't have to find out who is covered by your insurance plan, it is a good idea to ask around and get  recommendations. You will then need to call the office and see if the doctor you have chosen will accept you as a new patient and what types of options they offer for patients who are self-pay. Some doctors offer discounts or will set up payment plans for their patients who do not have insurance, but you will need to ask so you aren't surprised when you get to your appointment.  2) Contact Your Local Health Department Not all health departments have doctors that can see patients for sick visits, but many do, so it is worth a call to see if yours does. If you don't know where your local health department is, you can check in your phone book. The CDC also has a tool to help you locate your state's health department, and many state websites also have listings of all of their local health departments.  3) Find a [facility] If your illness is not likely to be very severe or complicated, you may want to try a walk in clinic. These are popping up all over the country in pharmacies, drugstores, and shopping centers. They're usually staffed by nurse practitioners or physician assistants that have been trained to treat common illnesses and complaints. They're usually fairly quick and inexpensive. However, if you have serious medical issues or chronic medical problems, these are probably not your best option.  No Primary Care Doctor: - Call Health Connect at  [phone] - they can help you locate a primary care doctor that  accepts your insurance, provides certain services, etc. - Physician Referral Service- [phone]  Chronic Pain Problems: Organization         Address  Phone   Notes  [facility]  [phone] Patients need to be referred by their primary care doctor.   Medication Assistance: Organization         Address  Phone   Notes  [facility] Medication [facility] [place] [place], [place] [address] [phone] --Must be a resident of  [facility] -- Must have NO insurance coverage whatsoever (no Medicaid/ Medicare, etc.) -- The pt. MUST have a primary care doctor that directs their care regularly and follows them in the community   [facility]  [phone]   [employer]  [phone]    Agencies that provide inexpensive medical care: Organization         Address  Phone   Notes  [place]  [phone]   [facility]    [phone]   [facility] [place], [place] [address] [phone]   [place] [address] [place] [phone]   Planned Parenthood    [phone]   [facility]    [phone]   [place] and [address], [place] Phone:  [phone], Fax:  [phone] Hours of Operation:  9 am - 6 pm, M-F.  Also accepts Medicaid/Medicare and self-pay.  [facility] for [address] [place], [address], [place] Phone: [phone], Fax: [phone]. Hours of Operation:  8:30 am - 5:30 pm, M-F.  Also accepts Medicaid and self-pay.  [facility] [place] [address], [place] Phone: [phone]   [employer], [place], [place] [phone], Ext. [phone] Mondays & Thursdays: 7-9 AM.  First 15 patients are seen on a first come, first serve basis.    [place] Providers:  Organization         Address  Phone   Notes  [facility] [address], [place] [phone] Also accepts self-pay patients.  [facility] [address], [place]  [phone]   [place], [address], [place] [phone]   [facility] [address] , [place] [phone]   Lucianne Lei [address], [place]   [phone] Only accepts [place] Access  [place] patients after they have their name applied to their card.   Self-Pay (no insurance) in [facility]:  Organization         Address  Phone   Notes  Sickle Cell Patients, [facility] Internal Medicine [place] [phone]   [facility] [place] [phone]   [facility] [place]  [place], [place], [place] [phone]   [facility]/Dr. Osei-Bonsu  [address], [place] [phone] Phone number for both [place] and [place] locations is the same.  [facility] [address] , [place] [phone]   [facility] [address] [place] or [address] [phone]   [facility] [address] [place] [phone], phone; [phone], fax Sees patients 1st and 3rd Saturday of every month.  Must not qualify for public or private insurance (i.e. Medicaid, Medicare, [place] Health Choice, Veterans' Benefits)  Household income should be no more than 200% of the poverty level The clinic cannot treat you if you are pregnant or think you are pregnant  Sexually transmitted diseases are not treated at the clinic.    Dental Care: Organization         Address  Phone  Notes  [facility] Department of [facility] [place] [phone] Accepts children up to age 81 who are enrolled in [place] or [place]; pregnant women with a Medicaid card; and children who have applied for Medicaid or [place] Health Choice, but were declined, whose parents can pay a reduced fee at time of service.  [facility] Department of [facility]  [address], [place] [phone] Accepts children up to age 72 who are enrolled in [place] or [place]; pregnant women with a Medicaid  card; and children who have applied for Medicaid or [place] Health Choice, but were declined, whose parents can pay a reduced fee at time of service.  [place] Adult Dental Access PROGRAM  [place] [phone] Patients are seen by appointment only. Walk-ins are not accepted. [place] will see patients 9 years of age and older. Monday - Tuesday (  8am-5pm) Most Wednesdays (8:30-5pm) $30 per visit, cash only  [facility] Adult Dental Access PROGRAM  [address], [facility] [phone] Patients are seen by appointment only. Walk-ins are not accepted. [place] will see patients 67 years of age and older. One Wednesday Evening (Monthly: Volunteer Based).  $30 per visit, cash only  [place]  [phone] for adults; Children under age 94, call Graduate Pediatric Dentistry at [phone]. Children aged 48-14, please call [phone] to request a pediatric application.  Dental services are provided in all areas of dental care including fillings, crowns and bridges, complete and partial dentures, implants, gum treatment, root canals, and extractions. Preventive care is also provided. Treatment is provided to both adults and children. Patients are selected via a lottery and there is often a waiting list.   [facility] [address] [place]  [phone] [email or web address]   [facility] [address] [place], [place] [phone], Ext. [phone] Second and Fourth Thursday of each month, opens at 6:30 AM; Clinic ends at 9 AM.  Patients are seen on a first-come first-served basis, and a limited number are seen during each clinic.   [facility]  [address] Hillard Danker [place], [place] [phone]   Eligibility Requirements You must have lived in [place], [place], or [place] counties for at least the last three months.   You cannot be eligible for state or federal sponsored [employer],  including [employer], [place], or [employer].   You generally cannot be eligible for healthcare insurance through your employer.    How to apply: Eligibility screenings are held every Tuesday and Wednesday afternoon from 1:00 pm until 4:00 pm. You do not need an appointment for the interview!  [facility] [address] [place], [place]   [place]  Central Department  [place]  [phone]    Behavioral Health Resources in the Community: Intensive Outpatient Programs Organization         Address  Phone  Notes  [place]. [address], [place], [place] [phone]   [facility] Outpatient [address] , [place], [place]   ADS: [facility] [address] [place], [place]   [place] [address]  [place], [place] or [phone]   Substance Abuse Resources Organization         Address  Phone  Notes  Alcohol and Drug Services  [phone]   [place]  [phone]   The [place]   Chinita Pester  [phone]   Residential & Outpatient Substance Abuse Program  [phone]   Psychological Services Organization         Address  Phone  Notes  [facility]  [place]  [phone]   [place] [address], [place] or [phone]    Mobile Crisis Teams Organization         Address  Phone  Notes  [facility], Mobile Crisis Care Unit  [phone]   [facility]  [address] [place], [place]   [place] [address] [place] [phone]    Self-Help/Support Groups Organization         Address  Phone  Notes  Mental Health Assoc.  of [place] - variety of support groups  [place] Call for more information  Narcotics Anonymous (NA), [facility] [address], [employer] [place]  2 meetings at this location   Special educational needs teacher         Address  Phone  Notes  [facility] [place],    [place]  [phone]   [facility]  [address], [place] [address]51[address], [place], [place]   [place], [place] [phone] Admissions: 8am-3pm M-F  Incentives Substance [place] [address],    [place], [place] [phone]   [facility] [address] [place], [place], [employer]   [facility] [address],  [place], [place]   [facility] - Intensive Outpatient [address], [place], [place]   [facility].,  [place], [place] [phone] or [phone]   [facility] ([facility]) [address] [place], [place] Accepts Medicaid  [facility] [place] [address]  [place] [phone] Substance Abuse/Addiction Treatment   [facility] Organization         Address  Phone  Notes  [facility]  [phone]   Domenic Schwab, PhD [address] Arlis Port[place], [place]   [phone] or [phone]   [place], [place] [phone]   [facility] [phone] [address] [place], [place] [phone] Insurance/Medicaid/sponsorship through [facility] [address] [place]                                    [place], [place] [phone] [place] [address], [place] [phone]    Dr. Adele Schilder  [phone]   [facility] 1) [address] [place] 2)  [place] 3)  [place] [address], [place] [phone]  [phone]   [place] [phone] or [phone] (After Hours)

## 2015-04-01 NOTE — ED Notes (Signed)
Patient reports back pain from fall on Oct 15, when he was running from dog. Reports landing on his back on cement, with lower back pain, uncontrolled at home.

## 2015-04-01 NOTE — ED Notes (Signed)
Pt called out for "sonething to help me sleep."  Pt stated to this RN that pt uses 5mg  Xanax to sleep, reports has not slept for past three nights d/t out of meds.  This RN discussed nonpharm relaxation techniques.  Pt agreed to try breathing exercise.

## 2015-04-01 NOTE — ED Notes (Signed)
PT has called out for 8th time requesting IV pain mediation with DC papers. Pt informed for the 8th time that MD is aware of this request.

## 2015-04-01 NOTE — ED Notes (Signed)
Verified with Dr. Preston FleetingGlick, patient is able to travel to MRI with fusion in place.

## 2015-04-01 NOTE — ED Provider Notes (Signed)
Care assumed from Dr. Preston FleetingGlick MD at shift change, pt here with chronic low back pain, social work consult pending.   Physical Exam  BP 117/78 mmHg  Pulse 69  Temp(Src) 98.7 F (37.1 C) (Oral)  Resp 18  Ht 5\' 8"  (1.727 m)  Wt 163 lb (73.936 kg)  BMI 24.79 kg/m2  SpO2 100%  Physical Exam Gen: afebrile, VSS, NAD HEENT: EOMI, MMM Resp: no resp distress CV: rate WNL Abd: appearance normal, nondistended MsK: moving all extremities with ease, ambulating with a cane Neuro: A&O x4   ED Course  Procedures Results for orders placed or performed during the hospital encounter of 01/12/14  CBC with Differential  Result Value Ref Range   WBC 9.1 4.0 - 10.5 K/uL   RBC 5.13 4.22 - 5.81 MIL/uL   Hemoglobin 15.6 13.0 - 17.0 g/dL   HCT 16.143.1 09.639.0 - 04.552.0 %   MCV 84.0 78.0 - 100.0 fL   MCH 30.4 26.0 - 34.0 pg   MCHC 36.2 (H) 30.0 - 36.0 g/dL   RDW 40.912.0 81.111.5 - 91.415.5 %   Platelets 170 150 - 400 K/uL   Neutrophils Relative % 61 43 - 77 %   Neutro Abs 5.6 1.7 - 7.7 K/uL   Lymphocytes Relative 30 12 - 46 %   Lymphs Abs 2.7 0.7 - 4.0 K/uL   Monocytes Relative 8 3 - 12 %   Monocytes Absolute 0.7 0.1 - 1.0 K/uL   Eosinophils Relative 0 0 - 5 %   Eosinophils Absolute 0.0 0.0 - 0.7 K/uL   Basophils Relative 1 0 - 1 %   Basophils Absolute 0.1 0.0 - 0.1 K/uL  Basic metabolic panel  Result Value Ref Range   Sodium 139 137 - 147 mEq/L   Potassium 3.9 3.7 - 5.3 mEq/L   Chloride 102 96 - 112 mEq/L   CO2 21 19 - 32 mEq/L   Glucose, Bld 108 (H) 70 - 99 mg/dL   BUN 8 6 - 23 mg/dL   Creatinine, Ser 7.820.81 0.50 - 1.35 mg/dL   Calcium 8.6 8.4 - 95.610.5 mg/dL   GFR calc non Af Amer >90 >90 mL/min   GFR calc Af Amer >90 >90 mL/min   Anion gap 16 (H) 5 - 15  Ethanol  Result Value Ref Range   Alcohol, Ethyl (B) 192 (H) 0 - 11 mg/dL   Mr Lumbar Spine Wo Contrast  04/01/2015  CLINICAL DATA:  Back pain after fall October 15th while running with his dog. Severe back pain rating to bilateral lower extremities.  EXAM: MRI LUMBAR SPINE WITHOUT CONTRAST TECHNIQUE: Multiplanar, multisequence MR imaging of the lumbar spine was performed. No intravenous contrast was administered. COMPARISON:  CT abdomen and pelvis February 07, 2008 FINDINGS: Lumbar vertebral bodies are intact and aligned, maintenance of lumbar lordosis. Using the reference level of the last well-formed intervertebral disc as L5-S1, severe L5-S1 disc height loss, moderate at L2-3 and mild at L3-4 with decreased T2 signal within the lumbar disc consistent with mild desiccation. Moderate acute on chronic discogenic endplate change at L5-S1. Mild to moderate subacute to chronic discogenic endplate changes L1-2 through L4-5. Scattered chronic Schmorl's nodes. No STIR signal abnormality to suggest fracture. Included view of the thoracic spine demonstrates T11 and T12 pedicles screws. Conus medullaris terminates at L1 and appears normal in morphology and signal characteristics. Cauda equina is normal in appearance. Included prevertebral paraspinal soft tissues are normal. Level by level evaluation: T12-L1: Small LEFT central disc protrusion and  annular fissure, very mild canal stenosis without neural foraminal narrowing. L1-2: Annular bulging, no canal stenosis or neural foraminal narrowing. Small broad-based disc bulge, very mild canal stenosis without neural foraminal narrowing. L3-4: Very small broad-based disc bulge, mild facet arthropathy and ligamentum flavum redundancy without canal stenosis. Mild RIGHT neural foraminal narrowing. L4-5: Mild annular bulging, mild facet arthropathy and ligamentum flavum redundancy without canal stenosis. Mild bilateral neural foraminal narrowing. L5-S1: Moderate broad-based disc bulge asymmetric to the RIGHT contacts the exited RIGHT L5 nerve. Status post apparent bilateral remote L5 laminectomies. No canal stenosis. Moderate to severe RIGHT L5-S1 neural foraminal narrowing, the exiting RIGHT L5 nerve appears somewhat enlarged  edematous. Moderate LEFT neural foraminal narrowing. IMPRESSION: No acute lumbar spine fracture malalignment. Degenerative change of the lumbar spine. Status post apparent bilateral L5 laminectomies. Very mild canal stenosis T12-L1 associated with small LEFT central disc protrusion and annular fissure. Neural foraminal narrowing L3-4 through L5-S1: Moderate to severe on the RIGHT L5-S1 with findings of RIGHT L5 neuritis. Electronically Signed   By: Awilda Metro M.D.   On: 04/01/2015 06:41     Meds ordered this encounter  Medications  . fentaNYL (SUBLIMAZE) injection 50 mcg    Sig:   . ketorolac (TORADOL) 30 MG/ML injection 30 mg    Sig:   . fentaNYL (SUBLIMAZE) injection 50 mcg    Sig:   . methocarbamol (ROBAXIN) 1,000 mg in dextrose 5 % 50 mL IVPB    Sig:   . oxyCODONE (Oxy IR/ROXICODONE) immediate release tablet 10 mg    Sig:   . fentaNYL (SUBLIMAZE) injection 100 mcg    Sig:      MDM:   ICD-9-CM ICD-10-CM   1. Chronic midline low back pain without sciatica 724.2 M54.5    338.29 G89.29   2. 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 Neural foraminal stenosis of lumbar spine 724.02 M48.06     12:25 PM Pt doesn't want to wait any longer for social worker, last dose of fentanyl helped. Pt has opana at home for pain, sees Dr. Farris Has for chronic pain, discussed to make an appt with him to have adjustments of pain management regimen. Discussed tylenol/motrin as needed for additional pain relief, in addition to home opana. Heat therapy discussed. F/up with PCP and his orthopedist for ongoing management. I explained the diagnosis and have given explicit precautions to return to the ER including for any other new or worsening symptoms. The patient understands and accepts the medical plan as it's been dictated and I have answered their questions. Discharge instructions concerning home care and prescriptions have been given. The patient is STABLE and is discharged to home in  good condition.  BP 117/78 mmHg  Pulse 69  Temp(Src) 98.7 F (37.1 C) (Oral)  Resp 18  Ht  (1.727 m)  Wt 163 lb (73.936 kg)  BMI 24.79 kg/m2  SpO2 100%     Sung Renton Camprubi-Soms, PA-C 04/01/15 1227  Dione Booze, MD 04/01/15 2259

## 2015-05-01 ENCOUNTER — Emergency Department (HOSPITAL_COMMUNITY): Payer: Medicaid Other

## 2015-05-01 ENCOUNTER — Emergency Department (HOSPITAL_COMMUNITY)
Admission: EM | Admit: 2015-05-01 | Discharge: 2015-05-01 | Disposition: A | Discharge status: Home / Self Care | Payer: Medicaid Other | Attending: Emergency Medicine | Admitting: Emergency Medicine

## 2015-05-01 ENCOUNTER — Encounter (HOSPITAL_COMMUNITY): Payer: Self-pay

## 2015-05-01 DIAGNOSIS — J449 Chronic obstructive pulmonary disease, unspecified: Secondary | ICD-10-CM | POA: Insufficient documentation

## 2015-05-01 DIAGNOSIS — Y9389 Activity, other specified: Secondary | ICD-10-CM | POA: Diagnosis not present

## 2015-05-01 DIAGNOSIS — W1839XA Other fall on same level, initial encounter: Secondary | ICD-10-CM | POA: Diagnosis not present

## 2015-05-01 DIAGNOSIS — Y9289 Other specified places as the place of occurrence of the external cause: Secondary | ICD-10-CM | POA: Insufficient documentation

## 2015-05-01 DIAGNOSIS — Z760 Encounter for issue of repeat prescription: Secondary | ICD-10-CM | POA: Insufficient documentation

## 2015-05-01 DIAGNOSIS — Z79899 Other long term (current) drug therapy: Secondary | ICD-10-CM | POA: Diagnosis not present

## 2015-05-01 DIAGNOSIS — G8929 Other chronic pain: Secondary | ICD-10-CM | POA: Diagnosis not present

## 2015-05-01 DIAGNOSIS — W19XXXA Unspecified fall, initial encounter: Secondary | ICD-10-CM

## 2015-05-01 DIAGNOSIS — Y998 Other external cause status: Secondary | ICD-10-CM | POA: Diagnosis not present

## 2015-05-01 DIAGNOSIS — S3992XA Unspecified injury of lower back, initial encounter: Secondary | ICD-10-CM | POA: Insufficient documentation

## 2015-05-01 DIAGNOSIS — F17210 Nicotine dependence, cigarettes, uncomplicated: Secondary | ICD-10-CM | POA: Insufficient documentation

## 2015-05-01 MED ORDER — IBUPROFEN 400 MG PO TABS
800.0000 mg | ORAL_TABLET | Freq: Once | ORAL | Status: DC
  Filled 2015-05-01: qty 2

## 2015-05-01 MED ORDER — ACETAMINOPHEN 325 MG PO TABS
650.0000 mg | ORAL_TABLET | Freq: Once | ORAL | Status: DC
  Filled 2015-05-01: qty 2

## 2015-05-01 NOTE — ED Notes (Signed)
Pt agitated he is receiving tylenol and ibuprofen for pain - pt requesting stronger pain meds. Dorathy DaftKayla, PA made aware. Discussed with patient he is to receive his x-rays to r/o acute injuries and then pain management will be discussed further.
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
## 2015-05-01 NOTE — Discharge Instructions (Signed)

## 2015-05-01 NOTE — ED Notes (Signed)
Pt reports fall and also wanting a dose of his opana. Pt reporting pain all over entire body and torso from fall, unable to locate pin point location of pain. States he takes 40 and 10 of opana

## 2015-05-01 NOTE — ED Notes (Signed)
Pt left prior to receiving discharge papers.

## 2015-05-01 NOTE — ED Provider Notes (Signed)
CSN: [id number]     Arrival date & time 05/01/15  2104 History  By signing my name below, I, Jonathan Smith, attest that this documentation has been prepared under the direction and in the presence of Jonathan Fowler, PA-C Electronically Signed: Jarvis Smith, ED Scribe. 05/01/2015. 10:54 PM.    Chief Complaint  Patient presents with  . Fall  . Medication Refill    The history is provided by the patient. No language interpreter was used.    HPI Comments: Jonathan Smith is a 59 y.o. male with a h/o chronic back pain who presents to the Emergency Department for constant, right side pain onset 2 days ago. Pt reports he fell from a standing position and landed onto the right side of his torso. He admits that he has had multiple surgeries on his back and previously had hardware placed in his back. He notes the pain is exacerbated with movement and deep inspiration. Pt takes Opana 2x daily for mgmt of his chronic back pain and he is asking for a refill of this medication at this time. He states he has also been taking Flexeril for mgmt of his right sided pain with no significant relief. . Pt denies any LOC or head injury from the fall. He denies any numbness, weakness, tingling, abdominal pain or other associated symptoms.   Past Medical History  Diagnosis Date  . Lung nodule   . COPD (chronic obstructive pulmonary disease) (HCC)   . Chronic back pain    Past Surgical History  Procedure Laterality Date  . Cervical spine surgery    . Appendectomy     Family History  Problem Relation Age of Onset  . Emphysema Paternal Grandfather     smoked   Social History  Substance Use Topics  . Smoking status: Current Every Day Smoker -- 0.50 packs/day for 30 years    Types: Cigarettes  . Smokeless tobacco: Never Used  . Alcohol Use: Yes     Comment: occasional, "i had some today 04/01/15, because it was emergency"    Review of Systems  All other systems negative unless otherwise stated in  HPI     Allergies  Review of patient's allergies indicates no known allergies.  Home Medications   Prior to Admission medications   Medication Sig Start Date End Date Taking? Authorizing Provider  ALPRAZolam Prudy Feeler) 0.5 MG tablet Take 0.5 mg by mouth 2 (two) times daily.    Historical Provider, MD  fluticasone (FLONASE) 50 MCG/ACT nasal spray Place 1 spray into both nostrils daily as needed for allergies or rhinitis.    Historical Provider, MD  gabapentin (NEURONTIN) 300 MG capsule Take 300 mg by mouth 3 (three) times daily.    Historical Provider, MD  ibuprofen (ADVIL,MOTRIN) 200 MG tablet Take 400-1,000 mg by mouth every 6 (six) hours as needed for moderate pain.    Historical Provider, MD  mometasone-formoterol (DULERA) 200-5 MCG/ACT AERO Take 2 puffs first thing in am and then another 2 puffs about 12 hours later. 08/22/14   Nyoka Cowden, MD  oxymorphone (OPANA ER) 40 MG 12 hr tablet Take 40 mg by mouth 2 (two) times daily.    Historical Provider, MD  oxymorphone (OPANA) 10 MG tablet Take 10 mg by mouth 2 (two) times daily as needed for pain.    Historical Provider, MD  tiZANidine (ZANAFLEX) 4 MG tablet Take 4 mg by mouth 2 (two) times daily.    Historical Provider, MD  zolpidem (AMBIEN) 10 MG  tablet Take 10 mg by mouth at bedtime.    Historical Provider, MD   Triage Vitals: BP 125/95 mmHg  Pulse 111  Temp(Src) 98.2 F (36.8 C) (Oral)  Resp 18  Ht 5\' 8"  (1.727 m)  Wt 164 lb (74.39 kg)  BMI 24.94 kg/m2  SpO2 95%  Physical Exam  Constitutional: He is oriented to person, place, and time. He appears well-developed and well-nourished. No distress.  HENT:  Head: Normocephalic and atraumatic.  Eyes: Conjunctivae and EOM are normal.  Neck: Normal range of motion. Neck supple. No tracheal deviation present.  Cardiovascular: Normal rate, regular rhythm, normal heart sounds and intact distal pulses.   Pulses:      Radial pulses are 2+ on the right side, and 2+ on the left side.        Posterior tibial pulses are 2+ on the right side, and 2+ on the left side.  Pulmonary/Chest: Effort normal and breath sounds normal. No respiratory distress. He has no wheezes. He has no rales. He exhibits tenderness.    Abdominal: Soft. Bowel sounds are normal. He exhibits no distension. There is no tenderness. There is no rebound.  Musculoskeletal: Normal range of motion.  Neurological: He is alert and oriented to person, place, and time.  Strength and sensation intact bilaterally in upper and lower extremities.   Skin: Skin is warm, dry and intact. No abrasion, no bruising, no ecchymosis, no laceration and no lesion noted. No erythema.  Psychiatric: He has a normal mood and affect. His behavior is normal.  Nursing note and vitals reviewed.   ED Course  Procedures (including critical care time)  DIAGNOSTIC STUDIES: Oxygen Saturation is 95% on RA, normal by my interpretation.    COORDINATION OF CARE: 9:48 PM- Will order CXR, Tylenol and Motrin.  Pt advised of plan for treatment and pt agrees.  10:15 PM- Pt refused Tylenol and Motrin. Pt advised of plan for treatment and pt agrees.      Labs Review Labs Reviewed - No data to display  Imaging Review Dg Chest 2 View  05/01/2015  CLINICAL DATA:  Right rib pain, chest soreness, and dizziness. Fell at home 2 days ago. Trip and fall injury. Smoker. COPD. EXAM: CHEST  2 VIEW COMPARISON:  05/17/2013 FINDINGS: Emphysematous changes in the lungs with central interstitial pattern and bronchial wall thickening suggesting chronic bronchitis. Normal heart size and pulmonary vascularity. No focal airspace disease or consolidation in the lungs. No blunting of costophrenic angles. No pneumothorax. Mediastinal contours appear intact. Postoperative changes in the cervical spine and low thoracic spine. Visualized ribs are nondisplaced. IMPRESSION: Emphysematous and chronic bronchitic changes in the lungs. No evidence of active pulmonary disease.  Electronically Signed   By: Burman NievesWilliam  Stevens M.D.   On: 05/01/2015 22:32   I have personally reviewed and evaluated these images and lab results as part of my medical decision-making.   EKG Interpretation None      MDM   Final diagnoses:  Fall, initial encounter  Chronic pain    Patient presents after fall 2 days ago complaining of right sided pain chest wall pain. VSS, NAD.  On exam, mild right chest wall tenderness.  No abdominal tenderness.  Heart rate tachycardic.  I suspect because patient becomes "emotional" and asking for pain medication.  Lungs CTAB.  NVI.  Will give motrin and tylenol for pain.  Will obtain CXR to evaluate for rib fracture.  Patient lying comfortably in the bed talking on his cell phone without difficulty.  CXR negative for acute fracture.  I extensively discussed the importance of following up with his chronic pain management doctor, and that we are not able to provide refills on his medications through the ED.  Patient became angry and got out of bed very quickly when I offered Motrin for pain management.  Evaluation does not show pathology requring ongoing emergent intervention or admission. Pt is hemodynamically stable and mentating appropriately. Discussed findings/results and plan with patient/guardian, who agrees with plan. All questions answered. Return precautions discussed and outpatient follow up given.   I personally performed the services described in this documentation, which was scribed in my presence. The recorded information has been reviewed and is accurate.     Jonathan Fowler, PA-C 05/01/15 1914  Nelva Nay, MD 05/11/15 [phone]

## 2015-05-01 NOTE — ED Notes (Signed)
Patient transported to X-ray 

## 2015-06-28 ENCOUNTER — Emergency Department (HOSPITAL_COMMUNITY)
Admission: EM | Admit: 2015-06-28 | Discharge: 2015-06-28 | Disposition: A | Discharge status: Home / Self Care | Payer: Medicaid Other | Attending: Emergency Medicine | Admitting: Emergency Medicine

## 2015-06-28 ENCOUNTER — Encounter (HOSPITAL_COMMUNITY): Payer: Self-pay | Admitting: Emergency Medicine

## 2015-06-28 DIAGNOSIS — Z87828 Personal history of other (healed) physical injury and trauma: Secondary | ICD-10-CM | POA: Insufficient documentation

## 2015-06-28 DIAGNOSIS — F911 Conduct disorder, childhood-onset type: Secondary | ICD-10-CM | POA: Diagnosis not present

## 2015-06-28 DIAGNOSIS — Z79891 Long term (current) use of opiate analgesic: Secondary | ICD-10-CM | POA: Diagnosis not present

## 2015-06-28 DIAGNOSIS — J449 Chronic obstructive pulmonary disease, unspecified: Secondary | ICD-10-CM | POA: Insufficient documentation

## 2015-06-28 DIAGNOSIS — M549 Dorsalgia, unspecified: Secondary | ICD-10-CM | POA: Insufficient documentation

## 2015-06-28 DIAGNOSIS — Z7951 Long term (current) use of inhaled steroids: Secondary | ICD-10-CM | POA: Diagnosis not present

## 2015-06-28 DIAGNOSIS — Z79899 Other long term (current) drug therapy: Secondary | ICD-10-CM | POA: Insufficient documentation

## 2015-06-28 DIAGNOSIS — Z760 Encounter for issue of repeat prescription: Secondary | ICD-10-CM | POA: Insufficient documentation

## 2015-06-28 DIAGNOSIS — G8929 Other chronic pain: Secondary | ICD-10-CM | POA: Diagnosis not present

## 2015-06-28 DIAGNOSIS — F17210 Nicotine dependence, cigarettes, uncomplicated: Secondary | ICD-10-CM | POA: Insufficient documentation

## 2015-06-28 MED ORDER — KETOROLAC TROMETHAMINE 30 MG/ML IJ SOLN
30.0000 mg | Freq: Once | INTRAMUSCULAR | Status: AC
  Administered 2015-06-28: 30 mg via INTRAMUSCULAR
  Filled 2015-06-28: qty 1

## 2015-06-28 MED ORDER — FENTANYL CITRATE (PF) 100 MCG/2ML IJ SOLN
50.0000 ug | Freq: Once | INTRAMUSCULAR | Status: AC
  Administered 2015-06-28: 50 ug via INTRAMUSCULAR
  Filled 2015-06-28: qty 2

## 2015-06-28 NOTE — Discharge Instructions (Signed)
Chronic Back Pain  When back pain lasts longer than 3 months, it is called chronic back pain.People with chronic back pain often go through certain periods that are more intense (flare-ups).  CAUSES Chronic back pain can be caused by wear and tear (degeneration) on different structures in your back. These structures include:  The bones of your spine (vertebrae) and the joints surrounding your spinal cord and nerve roots (facets).  The strong, fibrous tissues that connect your vertebrae (ligaments). Degeneration of these structures may result in pressure on your nerves. This can lead to constant pain. HOME CARE INSTRUCTIONS  Avoid bending, heavy lifting, prolonged sitting, and activities which make the problem worse.  Take brief periods of rest throughout the day to reduce your pain. Lying down or standing usually is better than sitting while you are resting.  Take over-the-counter or prescription medicines only as directed by your caregiver. SEEK IMMEDIATE MEDICAL CARE IF:   You have weakness or numbness in one of your legs or feet.  You have trouble controlling your bladder or bowels.  You have nausea, vomiting, abdominal pain, shortness of breath, or fainting.   This information is not intended to replace advice given to you by your health care provider. Make sure you discuss any questions you have with your health care provider.   Follow up with your primary care provider for reevaluation and medication management. Return to the ED if you experience bowel or bladder incontinence, numbness or tingling in her extremities, fever.

## 2015-06-28 NOTE — ED Notes (Signed)
Pt got loud with PA; RN came in to help; Pt was trying to take photo of PA and record; RN asked he not do that and RN and PA left the room; Security called to room and Dr.Lockwood went to reassess pt; Pt was walking and talking in full sentences at times and then breaks down crying and sobbing about pain; Pt was showed report of recent pain meds obtained by pain management Md; Pt got really up set with PA

## 2015-06-28 NOTE — ED Notes (Addendum)
Pt requesting pain medication.  States he has chronic back pain.  Normally takes Opana and xanax and has been out since yesterday.  Pt hollering due to pain in triage room but appears in no distress while RN in room talking with pt.  When pt informed he was going to waiting room he began hollering again.

## 2015-06-28 NOTE — ED Notes (Addendum)
EMT took pt from triage waiting to fast track.  Pt is sitting in wheelchair talking on phone.  No distress noted.
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
## 2015-06-28 NOTE — ED Notes (Signed)
PA at bedside.
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
## 2015-07-24 ENCOUNTER — Encounter (HOSPITAL_COMMUNITY): Payer: Self-pay | Admitting: Emergency Medicine

## 2015-07-24 ENCOUNTER — Emergency Department (HOSPITAL_COMMUNITY)
Admission: EM | Admit: 2015-07-24 | Discharge: 2015-07-24 | Disposition: A | Discharge status: Home / Self Care | Payer: Medicaid Other | Attending: Emergency Medicine | Admitting: Emergency Medicine

## 2015-07-24 DIAGNOSIS — R4182 Altered mental status, unspecified: Secondary | ICD-10-CM | POA: Diagnosis not present

## 2015-07-24 DIAGNOSIS — Z7951 Long term (current) use of inhaled steroids: Secondary | ICD-10-CM | POA: Diagnosis not present

## 2015-07-24 DIAGNOSIS — M549 Dorsalgia, unspecified: Secondary | ICD-10-CM | POA: Insufficient documentation

## 2015-07-24 DIAGNOSIS — F419 Anxiety disorder, unspecified: Secondary | ICD-10-CM | POA: Insufficient documentation

## 2015-07-24 DIAGNOSIS — F10929 Alcohol use, unspecified with intoxication, unspecified: Secondary | ICD-10-CM

## 2015-07-24 DIAGNOSIS — G8929 Other chronic pain: Secondary | ICD-10-CM | POA: Insufficient documentation

## 2015-07-24 DIAGNOSIS — F1410 Cocaine abuse, uncomplicated: Secondary | ICD-10-CM | POA: Insufficient documentation

## 2015-07-24 DIAGNOSIS — F17210 Nicotine dependence, cigarettes, uncomplicated: Secondary | ICD-10-CM | POA: Diagnosis not present

## 2015-07-24 DIAGNOSIS — F10129 Alcohol abuse with intoxication, unspecified: Secondary | ICD-10-CM | POA: Insufficient documentation

## 2015-07-24 DIAGNOSIS — F1310 Sedative, hypnotic or anxiolytic abuse, uncomplicated: Secondary | ICD-10-CM | POA: Insufficient documentation

## 2015-07-24 DIAGNOSIS — Z79899 Other long term (current) drug therapy: Secondary | ICD-10-CM | POA: Insufficient documentation

## 2015-07-24 DIAGNOSIS — J449 Chronic obstructive pulmonary disease, unspecified: Secondary | ICD-10-CM | POA: Diagnosis not present

## 2015-07-24 DIAGNOSIS — E876 Hypokalemia: Secondary | ICD-10-CM | POA: Diagnosis not present

## 2015-07-24 LAB — ETHANOL: Alcohol, Ethyl (B): 228 mg/dL — ABNORMAL HIGH (ref <5)

## 2015-07-24 LAB — BASIC METABOLIC PANEL
Anion gap: 14 (ref 5–15)
BUN: 7 mg/dL (ref 6–20)
CHLORIDE: 100 mmol/L — AB (ref 101–111)
CO2: 19 mmol/L — ABNORMAL LOW (ref 22–32)
Calcium: 8.8 mg/dL — ABNORMAL LOW (ref 8.9–10.3)
Creatinine, Ser: 0.73 mg/dL (ref 0.61–1.24)
GFR calc Af Amer: >60 mL/min (ref >60)
GFR calc non Af Amer: >60 mL/min (ref >60)
GLUCOSE: 107 mg/dL — AB (ref 65–99)
POTASSIUM: 3.2 mmol/L — AB (ref 3.5–5.1)
Sodium: 133 mmol/L — ABNORMAL LOW (ref 135–145)

## 2015-07-24 LAB — RAPID URINE DRUG SCREEN, HOSP PERFORMED
Amphetamines: NOT DETECTED
BENZODIAZEPINES: POSITIVE — AB
Barbiturates: NOT DETECTED
COCAINE: POSITIVE — AB
OPIATES: NOT DETECTED
TETRAHYDROCANNABINOL: NOT DETECTED

## 2015-07-24 LAB — CBC
HCT: 41.8 % (ref 39.0–52.0)
HEMOGLOBIN: 14.9 g/dL (ref 13.0–17.0)
MCH: 29.4 pg (ref 26.0–34.0)
MCHC: 35.6 g/dL (ref 30.0–36.0)
MCV: 82.6 fL (ref 78.0–100.0)
Platelets: 165 10*3/uL (ref 150–400)
RBC: 5.06 MIL/uL (ref 4.22–5.81)
RDW: 11.8 % (ref 11.5–15.5)
WBC: 6.5 10*3/uL (ref 4.0–10.5)

## 2015-07-24 MED ORDER — IBUPROFEN 400 MG PO TABS
400.0000 mg | ORAL_TABLET | Freq: Once | ORAL | Status: AC
  Administered 2015-07-24: 400 mg via ORAL
  Filled 2015-07-24: qty 1

## 2015-07-24 MED ORDER — LORAZEPAM 1 MG PO TABS: 1.0000 mg | ORAL_TABLET | Freq: Once | ORAL | Status: DC

## 2015-07-24 MED ORDER — LORAZEPAM 2 MG/ML IJ SOLN
0.5000 mg | Freq: Once | INTRAMUSCULAR | Status: AC
  Administered 2015-07-24: 0.5 mg via INTRAVENOUS
  Filled 2015-07-24: qty 1

## 2015-07-24 MED ORDER — POTASSIUM CHLORIDE CRYS ER 20 MEQ PO TBCR
40.0000 meq | EXTENDED_RELEASE_TABLET | Freq: Once | ORAL | Status: AC
  Administered 2015-07-24: 40 meq via ORAL
  Filled 2015-07-24: qty 2

## 2015-07-24 NOTE — ED Notes (Signed)
security here for pt

## 2015-07-24 NOTE — ED Notes (Signed)
Pt given socks and shirt

## 2015-07-24 NOTE — Discharge Instructions (Signed)
It was our pleasure to provide your ER care today - we hope that you feel better.  Try taking motrin or aleve as need for pain.  Follow up with your primary care doctor, and/or your back specialist in the next couple days - call office today to arrange follow up with them.  If you are experiencing a mental health issue and/or crisis, go directly to [facility].  Avoid alcohol and/or cocaine use - see resource guide regarding community resources related to these issues.  From today's labs, your potassium level is mildly low (3.2) - eat plenty of fruits and vegetables, and follow up with your doctor.   Return to ER if worse, new symptoms, fevers, weakness, other concern.  No driving for the next 8 hours, or any time when drinking alcohol.     Alcohol Intoxication Alcohol intoxication occurs when the amount of alcohol that a person has consumed impairs his or her ability to mentally and physically function. Alcohol directly impairs the normal chemical activity of the brain. Drinking large amounts of alcohol can lead to changes in mental function and behavior, and it can cause many physical effects that can be harmful.  Alcohol intoxication can range in severity from mild to very severe. Various factors can affect the level of intoxication that occurs, such as the person's age, gender, weight, frequency of alcohol consumption, and the presence of other medical conditions (such as diabetes, seizures, or heart conditions). Dangerous levels of alcohol intoxication may occur when people drink large amounts of alcohol in a short period (binge drinking). Alcohol can also be especially dangerous when combined with certain prescription medicines or "recreational" drugs. SIGNS AND SYMPTOMS Some common signs and symptoms of mild alcohol intoxication include:  Loss of coordination.  Changes in mood and behavior.  Impaired judgment.  Slurred speech. As alcohol intoxication progresses to more severe  levels, other signs and symptoms will appear. These may include:  Vomiting.  Confusion and impaired memory.  Slowed breathing.  Seizures.  Loss of consciousness. DIAGNOSIS  Your health care provider will take a medical history and perform a physical exam. You will be asked about the amount and type of alcohol you have consumed. Blood tests will be done to measure the concentration of alcohol in your blood. In many places, your blood alcohol level must be lower than 80 mg/dL (4.09%) to legally drive. However, many dangerous effects of alcohol can occur at much lower levels.  TREATMENT  People with alcohol intoxication often do not require treatment. Most of the effects of alcohol intoxication are temporary, and they go away as the alcohol naturally leaves the body. Your health care provider will monitor your condition until you are stable enough to go home. Fluids are sometimes given through an IV access tube to help prevent dehydration.  HOME CARE INSTRUCTIONS  Do not drive after drinking alcohol.  Stay hydrated. Drink enough water and fluids to keep your urine clear or pale yellow. Avoid caffeine.   Only take over-the-counter or prescription medicines as directed by your health care provider.  SEEK MEDICAL CARE IF:   You have persistent vomiting.   You do not feel better after a few days.  You have frequent alcohol intoxication. Your health care provider can help determine if you should see a substance use treatment counselor. SEEK IMMEDIATE MEDICAL CARE IF:   You become shaky or tremble when you try to stop drinking.   You shake uncontrollably (seizure).   You throw up (vomit) blood. This  may be bright red or may look like black coffee grounds.   You have blood in your stool. This may be bright red or may appear as a black, tarry, bad smelling stool.   You become lightheaded or faint.  MAKE SURE YOU:   Understand these instructions.  Will watch your  condition.  Will get help right away if you are not doing well or get worse.   This information is not intended to replace advice given to you by your health care provider. Make sure you discuss any questions you have with your health care provider.   Document Released: 03/02/2005 Document Revised: 01/23/2013 Document Reviewed: 10/26/2012 Elsevier Interactive Patient Education 2016 Elsevier Inc.  Stimulant Use Disorder-Cocaine Cocaine is one of a group of powerful drugs called stimulants. Cocaine has medical uses for stopping nosebleeds and for pain control before minor nose or dental surgery. However, cocaine is misused because of the effects that it produces. These effects include:   A feeling of extreme pleasure.  Alertness.  High energy. Common street names for cocaine include coke, crack, blow, snow, and nose candy. Cocaine is snorted, dissolved in water and injected, or smoked.  Stimulants are addictive because they activate regions of the brain that produce both the pleasurable sensation of "reward" and psychological dependence. Together, these actions account for loss of control and the rapid development of drug dependence. This means you become ill without the drug (withdrawal) and need to keep using it to function.  Stimulant use disorder is use of stimulants that disrupts your daily life. It disrupts relationships with family and friends and how you do your job. Cocaine increases your blood pressure and heart rate. It can cause a heart attack or stroke. Cocaine can also cause death from irregular heart rate or seizures. SYMPTOMS Symptoms of stimulant use disorder with cocaine include:  Use of cocaine in larger amounts or over a longer period of time than intended.  Unsuccessful attempts to cut down or control cocaine use.  A lot of time spent obtaining, using, or recovering from the effects of cocaine.  A strong desire or urge to use cocaine (craving).  Continued use of  cocaine in spite of major problems at work, school, or home because of use.  Continued use of cocaine in spite of relationship problems because of use.  Giving up or cutting down on important life activities because of cocaine use.  Use of cocaine over and over in situations when it is physically hazardous, such as driving a car.  Continued use of cocaine in spite of a physical problem that is likely related to use. Physical problems can include:  Malnutrition.  Nosebleeds.  Chest pain.  High blood pressure.  A hole that develops between the part of your nose that separates your nostrils (perforated nasal septum).  Lung and kidney damage.  Continued use of cocaine in spite of a mental problem that is likely related to use. Mental problems can include:  Schizophrenia-like symptoms.  Depression.  Bipolar mood swings.  Anxiety.  Sleep problems.  Need to use more and more cocaine to get the same effect, or lessened effect over time with use of the same amount of cocaine (tolerance).  Having withdrawal symptoms when cocaine use is stopped, or using cocaine to reduce or avoid withdrawal symptoms. Withdrawal symptoms include:  Depressed or irritable mood.  Low energy or restlessness.  Bad dreams.  Poor or excessive sleep.  Increased appetite. DIAGNOSIS Stimulant use disorder is diagnosed by your  health care provider. You may be asked questions about your cocaine use and how it affects your life. A physical exam may be done. A drug screen may be ordered. You may be referred to a mental health professional. The diagnosis of stimulant use disorder requires at least two symptoms within 12 months. The type of stimulant use disorder depends on the number of signs and symptoms you have. The type may be:  Mild. Two or three signs and symptoms.  Moderate. Four or five signs and symptoms.  Severe. Six or more signs and symptoms. TREATMENT Treatment for stimulant use disorder is  usually provided by mental health professionals with training in substance use disorders. The following options are available:  Counseling or talk therapy. Talk therapy addresses the reasons you use cocaine and ways to keep you from using again. Goals of talk therapy include:  Identifying and avoiding triggers for use.  Handling cravings.  Replacing use with healthy activities.  Support groups. Support groups provide emotional support, advice, and guidance.  Medicine. Certain medicines may decrease cocaine cravings or withdrawal symptoms. HOME CARE INSTRUCTIONS  Take medicines only as directed by your health care provider.  Identify the people and activities that trigger your cocaine use and avoid them.  Keep all follow-up visits as directed by your health care provider. SEEK MEDICAL CARE IF:  Your symptoms get worse or you relapse.  You are not able to take medicines as directed. SEEK IMMEDIATE MEDICAL CARE IF:  You have serious thoughts about hurting yourself or others.  You have a seizure, chest pain, sudden weakness, or loss of speech or vision. FOR MORE INFORMATION  National Institute on Drug Abuse: http://www.price-smith.com/  Substance Abuse and Mental Health Services Administration: SkateOasis.com.pt   This information is not intended to replace advice given to you by your health care provider. Make sure you discuss any questions you have with your health care provider.   Document Released: 05/20/2000 Document Revised: 06/13/2014 Document Reviewed: 06/05/2013 Elsevier Interactive Patient Education 2016 [employer].    Hypokalemia Hypokalemia means that the amount of potassium in the blood is lower than normal.Potassium is a chemical, called an electrolyte, that helps regulate the amount of fluid in the body. It also stimulates muscle contraction and helps nerves function properly.Most of the body's potassium is inside of cells, and only a very small amount is in the blood.  Because the amount in the blood is so small, minor changes can be life-threatening. CAUSES  Antibiotics.  Diarrhea or vomiting.  Using laxatives too much, which can cause diarrhea.  Chronic kidney disease.  Water pills (diuretics).  Eating disorders (bulimia).  Low magnesium level.  Sweating a lot. SIGNS AND SYMPTOMS  Weakness.  Constipation.  Fatigue.  Muscle cramps.  Mental confusion.  Skipped heartbeats or irregular heartbeat (palpitations).  Tingling or numbness. DIAGNOSIS  Your health care provider can diagnose hypokalemia with blood tests. In addition to checking your potassium level, your health care provider may also check other lab tests. TREATMENT Hypokalemia can be treated with potassium supplements taken by mouth or adjustments in your current medicines. If your potassium level is very low, you may need to get potassium through a vein (IV) and be monitored in the hospital. A diet high in potassium is also helpful. Foods high in potassium are:  Nuts, such as peanuts and pistachios.  Seeds, such as sunflower seeds and pumpkin seeds.  Peas, lentils, and lima beans.  Whole grain and bran cereals and breads.  Fresh fruit  and vegetables, such as apricots, avocado, bananas, cantaloupe, kiwi, oranges, tomatoes, asparagus, and potatoes.  Orange and tomato juices.  Red meats.  Fruit yogurt. HOME CARE INSTRUCTIONS  Take all medicines as prescribed by your health care provider.  Maintain a healthy diet by including nutritious food, such as fruits, vegetables, nuts, whole grains, and lean meats.  If you are taking a laxative, be sure to follow the directions on the label. SEEK MEDICAL CARE IF:  Your weakness gets worse.  You feel your heart pounding or racing.  You are vomiting or having diarrhea.  You are diabetic and having trouble keeping your blood glucose in the normal range. SEEK IMMEDIATE MEDICAL CARE IF:  You have chest pain, shortness of  breath, or dizziness.  You are vomiting or having diarrhea for more than 2 days.  You faint. MAKE SURE YOU:   Understand these instructions.  Will watch your condition.  Will get help right away if you are not doing well or get worse.   This information is not intended to replace advice given to you by your health care provider. Make sure you discuss any questions you have with your health care provider.   Document Released: 05/23/2005 Document Revised: 06/13/2014 Document Reviewed: 11/23/2012 Elsevier Interactive Patient Education 2016 Elsevier Inc.   Chronic Back Pain  When back pain lasts longer than 3 months, it is called chronic back pain.People with chronic back pain often go through certain periods that are more intense (flare-ups).  CAUSES Chronic back pain can be caused by wear and tear (degeneration) on different structures in your back. These structures include:  The bones of your spine (vertebrae) and the joints surrounding your spinal cord and nerve roots (facets).  The strong, fibrous tissues that connect your vertebrae (ligaments). Degeneration of these structures may result in pressure on your nerves. This can lead to constant pain. HOME CARE INSTRUCTIONS  Avoid bending, heavy lifting, prolonged sitting, and activities which make the problem worse.  Take brief periods of rest throughout the day to reduce your pain. Lying down or standing usually is better than sitting while you are resting.  Take over-the-counter or prescription medicines only as directed by your caregiver. SEEK IMMEDIATE MEDICAL CARE IF:   You have weakness or numbness in one of your legs or feet.  You have trouble controlling your bladder or bowels.  You have nausea, vomiting, abdominal pain, shortness of breath, or fainting.   This information is not intended to replace advice given to you by your health care provider. Make sure you discuss any questions you have with your health care  provider.   Document Released: 06/30/2004 Document Revised: 08/15/2011 Document Reviewed: 11/10/2014 Elsevier Interactive Patient Education 2016 [employer].     Emergency Department Resource Guide 1) Find a Doctor and Pay Out of Pocket Although you won't have to find out who is covered by your insurance plan, it is a good idea to ask around and get recommendations. You will then need to call the office and see if the doctor you have chosen will accept you as a new patient and what types of options they offer for patients who are self-pay. Some doctors offer discounts or will set up payment plans for their patients who do not have insurance, but you will need to ask so you aren't surprised when you get to your appointment.  2) Contact Your Local Health Department Not all health departments have doctors that can see patients for sick visits, but many  do, so it is worth a call to see if yours does. If you don't know where your local health department is, you can check in your phone book. The CDC also has a tool to help you locate your state's health department, and many state websites also have listings of all of their local health departments.  3) Find a Walk-in Clinic If your illness is not likely to be very severe or complicated, you may want to try a walk in clinic. These are popping up all over the country in pharmacies, drugstores, and shopping centers. They're usually staffed by nurse practitioners or physician assistants that have been trained to treat common illnesses and complaints. They're usually fairly quick and inexpensive. However, if you have serious medical issues or chronic medical problems, these are probably not your best option.  No Primary Care Doctor: - Call Health Connect at  [phone] - they can help you locate a primary care doctor that  accepts your insurance, provides certain services, etc. - Physician Referral Service- [phone]  Chronic Pain  Problems: Organization         Address  Phone   Notes  [facility]  [phone] Patients need to be referred by their primary care doctor.   Medication Assistance: Organization         Address  Phone   Notes  [facility] Medication [facility] [address] [place] [place], [place] [address] [phone] --Must be a resident of [facility] -- Must have NO insurance coverage whatsoever (no Medicaid/ Medicare, etc.) -- The pt. MUST have a primary care doctor that directs their care regularly and follows them in the community   [facility]  [phone]   [employer]  [phone]    Agencies that provide inexpensive medical care: Organization         Address  Phone   Notes  [facility]  [phone]   [facility]    [phone]   [facility] [address] [place], [place] [address] [phone]   [facility] [address], [place] [phone]   Planned Parenthood    [phone]   [facility]    [phone]   [facility]  [address], [place] Phone:  [phone], Fax:  [phone] Hours of Operation:  9 am - 6 pm, M-F.  Also accepts Medicaid/Medicare and self-pay.  [facility]  [address], [place] Phone: [phone], Fax: [phone]. Hours of Operation:  8:30 am - 5:30 pm, M-F.  Also accepts Medicaid and self-pay.  [facility] [place] [address] [place] Phone: [phone]   [facility] [address] Natasha Bence [place], [place] [phone], Ext. [phone] Mondays & Thursdays: 7-9 AM.  First 15 patients are seen on a first come, first serve basis.    Medicaid-accepting [facility] Providers:  Organization         Address  Phone   Notes  [facility] [address], [place] [phone] Also  accepts self-pay patients.  [facility] [address] Laurell Josephs [place], [place]  [phone]   [facility] [address], [place] [phone]   [facility] [address] [place] [phone]   Renaye Rakers  [address], [place]   [phone] Only accepts [place] patients after they have their name applied to their card.   Self-Pay (no insurance) in [facility]:  Organization         Address  Phone   Notes  Sickle Cell Patients, [facility] [address] [place], [place] [phone]   [facility] [address] [place], [place] [phone]   [facility]  [address], [place] [phone]   [facility]/Dr. Osei-Bonsu  [address], [place] or [address], [place] [phone] Phone number for both [place] and [place] locations is the same.  [facility] [address] [place] [phone]   [facility] [address], [place] or [address] [phone]   [facility] [address] [place] [phone], phone; [phone], fax Sees patients 1st and 3rd Saturday of every month.  Must not qualify for public or private insurance (i.e. Medicaid, Medicare, [place] Health Choice, Veterans' Benefits)  Household income should be no more than 200% of the poverty level The clinic cannot treat you if you are pregnant or think you are pregnant  Sexually transmitted diseases are not treated at the clinic.    Dental Care: Organization         Address  Phone  Notes  [facility] [address] [place], [place] [phone] Accepts children up to age 69 who are enrolled in [place] or [place] Health Choice; pregnant  women with a Medicaid card; and children who have applied for Medicaid or [place] Health Choice, but were declined, whose parents can pay a reduced fee at time of service.  [facility] Department of [facility]  [address], [place] [phone] Accepts children up to age 78 who are enrolled in [place] or [place] Health Choice; pregnant women with a Medicaid card; and children who have applied for Medicaid or [place] Health Choice, but were declined, whose parents can pay a reduced fee at time of service.  [facility]  [address] [place], [place] [phone] Patients are seen by appointment only. Walk-ins are not accepted. [facility] will see patients 43 years of age and older. Monday - Tuesday (8am-5pm) Most Wednesdays (8:30-5pm) $30 per visit, cash only  [facility] Adult Dental Access PROGRAM  [address], [facility] [phone] Patients are seen by appointment only. Walk-ins are not accepted. [facility] will see patients 47 years of age and older. One Wednesday Evening (Monthly: Volunteer Based).  $30 per visit, cash only  [employer]  [phone] for adults; Children under age 30, call Graduate Pediatric Dentistry at [phone]. Children aged 57-14, please call [phone] to request a pediatric application.  Dental services are provided in all areas of dental care including fillings, crowns and bridges, complete and partial dentures, implants, gum treatment, root canals, and extractions. Preventive care is also provided. Treatment is provided to both adults and children. Patients are selected via a lottery and there is often a waiting list.   [facility] [address], [place]  [phone] [email or web address]   [facility] [address] [place], [place] [phone], Ext. [phone] Second and  Fourth Thursday of each month, opens at 6:30 AM; Clinic ends at 9 AM.  Patients are  seen on a first-come first-served basis, and a limited number are seen during each clinic.   [facility]  [address] [place], [place] [phone]   Eligibility Requirements You must have lived in [place], [place], or [place] counties for at least the last three months.   You cannot be eligible for state or federal sponsored [place], including [employer], [place], or [employer].   You generally cannot be eligible for healthcare insurance through your employer.    How to apply: Eligibility screenings are held every Tuesday and Wednesday afternoon from 1:00 pm until 4:00 pm. You do not need an appointment for the interview!  [facility] [address], [place], [place] [phone]   [facility] Health Department  [phone]   [facility] Health Department  [phone]   [facility] Health Department  [phone]    Behavioral Health Resources in the Community: Intensive Outpatient Programs Organization         Address  Phone  Notes  [facility] [address], [place], [place] [phone]   [facility] Outpatient [address] [place], [place] [phone]   [facility] [address] [place], [place]  [phone]   [facility] Mental Health [address]  [place], [place] [phone] or [phone]   Substance Abuse Resources Organization         Address  Phone  Notes  [facility]  [phone]   [facility]  [phone]   [facility]  [phone]   Floydene Flock  [phone]   Residential & Outpatient Substance Abuse Program  [phone]   Psychological Services Organization         Address  Phone  Notes  [facility] Behavioral Health  [phone]   [facility] Services  [phone]   [facility] Mental Health [address] [place] [phone] or [phone]    Mobile Crisis  Teams Organization         Address  Phone  Notes  [facility], Mobile Crisis Care Unit  [phone]   [facility]  [address] [place], [place] [phone]   Doristine Locks [address] [place] [phone]    Self-Help/Support Groups Organization         Address  Phone             Notes  [facility] - variety of support groups  [phone] Call for more information  Narcotics Anonymous (NA), [facility] [address], [employer]   2 meetings at this location   Statistician         Address  Phone  Notes  [facility] [address],    [place]  [phone]   [facility]  [address] [place] [address], [place], [place] [phone]   [facility] [address] [place], [place] [phone] Admissions: 8am-3pm M-F  [facility] [address],    [place], [place] [phone]   [facility] [address] [place], [place] [phone]   [facility] [address]  [place], [place] [phone]   [facility] Intensive Outpatient [address], [place], [place] [phone]   [facility] [phone] [address]  [place], [place] [phone] or [phone]   [facility] [address], [place], [place] [phone] Accepts Medicaid  [facility] [address],  [place] [phone] Substance Abuse/Addiction Treatment   [facility] Organization         Address  Phone  Notes  [facility]  [phone]   Angie Fava, PhD [address] [place], [place]   [phone] or [phone]   [facility]   [address] [place], [place] [phone]   [facility] [address] [place], [place] [phone]  Insurance/Medicaid/sponsorship through [facility] and Families [address]                                    [place], [place] [phone] Therapy/tele-psych/case  [facility] [address], [place] [phone]    Dr. Lolly Mustache  [phone]   [facility]  [employer] [facility] Dept. 1) [address] [place] 2) [address], [place] 3)  [address], [place] [phone]  [phone]   [facility] Child Abuse Hotline [phone] or [phone] (After Hours)

## 2015-07-24 NOTE — ED Notes (Signed)
Pt drank ginger ale and ate some crackers with meds
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
## 2015-11-21 ENCOUNTER — Other Ambulatory Visit: Payer: Self-pay | Admitting: Internal Medicine

## 2015-12-17 ENCOUNTER — Other Ambulatory Visit: Payer: Self-pay | Admitting: Internal Medicine

## 2016-01-07 ENCOUNTER — Ambulatory Visit (HOSPITAL_COMMUNITY)
Admission: AD | Admit: 2016-01-07 | Discharge: 2016-01-07 | Disposition: A | Discharge status: Home / Self Care | Payer: Medicaid Other | Attending: Psychiatry | Admitting: Psychiatry

## 2016-01-07 DIAGNOSIS — G8929 Other chronic pain: Secondary | ICD-10-CM | POA: Insufficient documentation

## 2016-01-07 DIAGNOSIS — M545 Low back pain: Secondary | ICD-10-CM | POA: Diagnosis not present

## 2016-01-07 DIAGNOSIS — R911 Solitary pulmonary nodule: Secondary | ICD-10-CM | POA: Diagnosis not present

## 2016-01-07 DIAGNOSIS — F319 Bipolar disorder, unspecified: Secondary | ICD-10-CM | POA: Diagnosis not present
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
## 2016-01-14 ENCOUNTER — Telehealth: Payer: Self-pay | Admitting: Internal Medicine

## 2016-01-14 NOTE — Telephone Encounter (Signed)
Spoke with Misty StanleyLisa @ [facility] and she is needing orders for pt's medications. Advise her that the [facility] and that was a year and a half ago and we have not seen pt since. She states that pt is on neb meds and other medications that may have come from his PCP. Advised her to contact PCP since we have no record of these changes. Nothing further needed.

## 2016-01-19 ENCOUNTER — Encounter (HOSPITAL_COMMUNITY): Payer: Self-pay

## 2016-01-19 ENCOUNTER — Emergency Department (HOSPITAL_COMMUNITY)
Admission: EM | Admit: 2016-01-19 | Discharge: 2016-01-21 | Disposition: A | Discharge status: Home / Self Care | Payer: Medicaid Other | Attending: Emergency Medicine | Admitting: Emergency Medicine

## 2016-01-19 DIAGNOSIS — F3161 Bipolar disorder, current episode mixed, mild: Secondary | ICD-10-CM | POA: Diagnosis not present

## 2016-01-19 DIAGNOSIS — F17210 Nicotine dependence, cigarettes, uncomplicated: Secondary | ICD-10-CM | POA: Insufficient documentation

## 2016-01-19 DIAGNOSIS — M549 Dorsalgia, unspecified: Secondary | ICD-10-CM | POA: Diagnosis present

## 2016-01-19 DIAGNOSIS — J449 Chronic obstructive pulmonary disease, unspecified: Secondary | ICD-10-CM | POA: Insufficient documentation

## 2016-01-19 DIAGNOSIS — Z79899 Other long term (current) drug therapy: Secondary | ICD-10-CM | POA: Insufficient documentation

## 2016-01-19 DIAGNOSIS — F319 Bipolar disorder, unspecified: Secondary | ICD-10-CM | POA: Diagnosis present

## 2016-01-19 LAB — CBC WITH DIFFERENTIAL/PLATELET
Basophils Absolute: 0 10*3/uL (ref 0.0–0.1)
Basophils Relative: 1 %
EOS ABS: 0.2 10*3/uL (ref 0.0–0.7)
EOS PCT: 4 %
HCT: 42.8 % (ref 39.0–52.0)
Hemoglobin: 14.9 g/dL (ref 13.0–17.0)
LYMPHS ABS: 2.2 10*3/uL (ref 0.7–4.0)
LYMPHS PCT: 40 %
MCH: 29.6 pg (ref 26.0–34.0)
MCHC: 34.8 g/dL (ref 30.0–36.0)
MCV: 84.9 fL (ref 78.0–100.0)
MONO ABS: 0.8 10*3/uL (ref 0.1–1.0)
MONOS PCT: 15 %
Neutro Abs: 2.2 10*3/uL (ref 1.7–7.7)
Neutrophils Relative %: 40 %
PLATELETS: 159 10*3/uL (ref 150–400)
RBC: 5.04 MIL/uL (ref 4.22–5.81)
RDW: 12.1 % (ref 11.5–15.5)
WBC: 5.6 10*3/uL (ref 4.0–10.5)

## 2016-01-19 LAB — COMPREHENSIVE METABOLIC PANEL
ALBUMIN: 3.6 g/dL (ref 3.5–5.0)
ALT: 95 U/L — AB (ref 17–63)
AST: 84 U/L — AB (ref 15–41)
Alkaline Phosphatase: 60 U/L (ref 38–126)
Anion gap: 6 (ref 5–15)
BUN: 12 mg/dL (ref 6–20)
CHLORIDE: 102 mmol/L (ref 101–111)
CO2: 28 mmol/L (ref 22–32)
CREATININE: 0.71 mg/dL (ref 0.61–1.24)
Calcium: 8.7 mg/dL — ABNORMAL LOW (ref 8.9–10.3)
GFR calc Af Amer: >60 mL/min (ref >60)
GFR calc non Af Amer: >60 mL/min (ref >60)
GLUCOSE: 99 mg/dL (ref 65–99)
POTASSIUM: 3.8 mmol/L (ref 3.5–5.1)
SODIUM: 136 mmol/L (ref 135–145)
Total Bilirubin: 0.5 mg/dL (ref 0.3–1.2)
Total Protein: 7.1 g/dL (ref 6.5–8.1)

## 2016-01-19 LAB — ETHANOL: Alcohol, Ethyl (B): <5 mg/dL (ref <5)

## 2016-01-19 MED ORDER — TRAMADOL HCL 50 MG PO TABS
100.0000 mg | ORAL_TABLET | Freq: Three times a day (TID) | ORAL | Status: DC
  Administered 2016-01-19 – 2016-01-21 (×5): 100 mg via ORAL
  Filled 2016-01-19 (×5): qty 2

## 2016-01-19 MED ORDER — FLUTICASONE PROPIONATE 50 MCG/ACT NA SUSP
1.0000 | Freq: Two times a day (BID) | NASAL | Status: DC
  Administered 2016-01-19 – 2016-01-21 (×4): 1 via NASAL
  Filled 2016-01-19: qty 16

## 2016-01-19 MED ORDER — TIZANIDINE HCL 4 MG PO TABS
4.0000 mg | ORAL_TABLET | Freq: Two times a day (BID) | ORAL | Status: DC | PRN
  Administered 2016-01-19 – 2016-01-20 (×2): 4 mg via ORAL
  Filled 2016-01-19 (×2): qty 1

## 2016-01-19 MED ORDER — MORPHINE SULFATE 15 MG PO TABS: 30.0000 mg | ORAL_TABLET | ORAL | Status: DC | PRN

## 2016-01-19 MED ORDER — GABAPENTIN 300 MG PO CAPS
300.0000 mg | ORAL_CAPSULE | Freq: Three times a day (TID) | ORAL | Status: DC
  Administered 2016-01-19 – 2016-01-21 (×4): 300 mg via ORAL
  Filled 2016-01-19 (×5): qty 1

## 2016-01-19 MED ORDER — ALPRAZOLAM 0.5 MG PO TABS: 0.5000 mg | ORAL_TABLET | Freq: Two times a day (BID) | ORAL | Status: DC

## 2016-01-19 MED ORDER — MOMETASONE FURO-FORMOTEROL FUM 200-5 MCG/ACT IN AERO: 2.0000 | INHALATION_SPRAY | Freq: Two times a day (BID) | RESPIRATORY_TRACT | Status: DC

## 2016-01-19 MED ORDER — MORPHINE SULFATE ER 100 MG PO TBCR: 100.0000 mg | EXTENDED_RELEASE_TABLET | Freq: Two times a day (BID) | ORAL | Status: DC

## 2016-01-19 MED ORDER — FLUTICASONE PROPIONATE 50 MCG/ACT NA SUSP: 1.0000 | Freq: Every day | NASAL | Status: DC | PRN

## 2016-01-19 NOTE — ED Notes (Signed)
Patient is resting comfortably. Patient will be medicated when he wakes up and vital signs will be obtained.
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
## 2016-01-19 NOTE — ED Notes (Signed)
Bed: [facility] Expected date:  Expected time:  Means of arrival:  Comments: Triaging in room
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
## 2016-01-20 DIAGNOSIS — F3161 Bipolar disorder, current episode mixed, mild: Secondary | ICD-10-CM | POA: Diagnosis not present

## 2016-01-20 NOTE — ED Notes (Signed)
Return call from patients son.  He states he will not be here to pick his father up as planned.  He states he has done everything in his power to help his father and is tired of being verbally abused and yelled at.  States as far as he is concerned patient can sleep under a bridge and think about what he has done.

## 2016-01-20 NOTE — ED Notes (Signed)
Patient refuses give urine sample and refuses to have vital signs taken.

## 2016-01-20 NOTE — ED Notes (Signed)
Patient walks to bathroom pushing bedside table and back to room doing the same. Patient states: "Jonathan NobleY'all got damn bitches think this is funny". Patient get's back to his room and  pushes table to aside and walk with a steady gait back to his bed.

## 2016-01-20 NOTE — ED Notes (Signed)
Patient has been verbally abusive and accusatory toward staff this morning.  He is calling staff names and talking very loud in the hallways, to the point of disruption of the unit.  Per Doylene Canninghomas Hughes, he will be picked up by the group home staff at 10 am.

## 2016-01-20 NOTE — [facility] Suicide Risk Assessment (Signed)
Suicide Risk Assessment  Discharge Assessment   [facility]   Principal Problem: Bipolar affective disorder, mixed, mild (HCC) Discharge Diagnoses:  Patient Active Problem List   Diagnosis Date Noted  . Bipolar affective disorder, mixed, mild (HCC) [F31.61] 01/20/2016    Priority: High  . Chest pain [R07.9] 09/30/2012  . Solitary pulmonary nodule [R91.1] 09/30/2012  . BACK PAIN [M54.9] 11/01/2006  . SYMPTOM, FREQUENCY, URINARY [R35.0] 09/18/2006  . ANXIETY [F41.1] 08/03/2006  . Cigarette smoker [Z72.0] 08/03/2006  . COPD mixed type [J44.9] 08/03/2006  . BACK PAIN, LOW [M54.5] 08/03/2006    Total Time spent with patient: 45 minutes  Musculoskeletal: Strength & Muscle Tone: within normal limits Gait & Station: unsteady Patient leans: N/A  Psychiatric Specialty Exam: Physical Exam  Constitutional: He is oriented to person, place, and time. He appears well-developed and well-nourished.  HENT:  Head: Normocephalic.  Neck: Normal range of motion.  Respiratory: Effort normal.  Musculoskeletal: Normal range of motion.  Neurological: He is alert and oriented to person, place, and time.  Skin: Skin is warm and dry.  Psychiatric: His speech is normal. Judgment and thought content normal. His affect is angry. Cognition and memory are normal.    Review of Systems  Constitutional: Negative.   HENT: Negative.   Eyes: Negative.   Respiratory: Negative.   Cardiovascular: Negative.   Gastrointestinal: Negative.   Genitourinary: Negative.   Musculoskeletal: Negative.   Skin: Negative.   Neurological: Negative.   Endo/Heme/Allergies: Negative.   Psychiatric/Behavioral: Negative.     Blood pressure 121/78, pulse 70, temperature 97.5 F (36.4 C), temperature source Oral, resp. rate 18, height 5' 8.5" (1.74 m), weight 74.4 kg (164 lb), SpO2 94 %.Body mass index is 24.57 kg/m.  General Appearance: Casual  Eye Contact:  Good  Speech:  Normal Rate  Volume:   Normal  Mood:  Irritable  Affect:  Congruent  Thought Process:  Coherent and Descriptions of Associations: Intact  Orientation:  Full (Time, Place, and Person)  Thought Content:  WDL  Suicidal Thoughts:  No  Homicidal Thoughts:  No  Memory:  Immediate;   Good Recent;   Good Remote;   Good  Judgement:  Fair  Insight:  Fair  Psychomotor Activity:  Normal  Concentration:  Concentration: Good and Attention Span: Good  Recall:  Good  Fund of Knowledge:  Fair  Language:  Good  Akathisia:  No  Handed:  Right  AIMS (if indicated):     Assets:  Housing Leisure Time Resilience Social Support  ADL's:  Intact  Cognition:  WNL  Sleep:      Mental Status Per Nursing Assessment::   On Admission:   hallucinations  Demographic Factors:  Male and Caucasian  Loss Factors: NA  Historical Factors: NA  Risk Reduction Factors:   Sense of responsibility to family, Living with another person, especially a relative, Positive social support and Positive therapeutic relationship  Continued Clinical Symptoms:  Irritability   Cognitive Features That Contribute To Risk:  None    Suicide Risk:  Minimal: No identifiable suicidal ideation.  Patients presenting with no risk factors but with morbid ruminations; may be classified as minimal risk based on the severity of the depressive symptoms    Plan Of Care/Follow-up recommendations:  Activity:  as tolerated Diet:  heart healthy diet  Ferlin Fairhurst, NP 01/20/2016, 9:00 AM
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
## 2016-01-20 NOTE — Discharge Instructions (Signed)
For your ongoing behavioral health needs you are advised to continue treatment with [facility].  New patients are seen at their walk-in clinic.  Walk-in hours are Monday - Friday from 8:00 am - 12:00 pm, and from 1:00 pm - 3:00 pm.  Walk-in patients are seen on a first come, first served basis, so try to arrive as early as possible for the best chance of being seen the same day.  There is an initial fee of $22.50:       [facility]      [address]      [place], [place] [id number]      [phone]

## 2016-01-20 NOTE — Consult Note (Signed)
[place] Psychiatry Consult   Reason for Consult:  Hallucinations  Referring Physician:  EDP Patient Identification: Jonathan Smith MRN:  [id number] Principal Diagnosis: Bipolar affective disorder, mixed, mild ([place]) Diagnosis:   Patient Active Problem List   Diagnosis Date Noted  . Bipolar affective disorder, mixed, mild ([place]) [F31.61] 01/20/2016    Priority: High  . Chest pain [R07.9] 09/30/2012  . Solitary pulmonary nodule [R91.1] 09/30/2012  . BACK PAIN [M54.9] 11/01/2006  . SYMPTOM, FREQUENCY, URINARY [R35.0] 09/18/2006  . ANXIETY [F41.1] 08/03/2006  . Cigarette smoker [Z72.0] 08/03/2006  . COPD mixed type [J44.9] 08/03/2006  . BACK PAIN, LOW [M54.5] 08/03/2006    Total Time spent with patient: 45 minutes  Subjective:   Jonathan Smith is a 60 y.o. male patient does not warrant admission.   HPI:  60 yo male who presented to the ED from [facility] claiming he was having hallucinations.  While in the ED, he has not been responding to internal stimuli.  Denies suicidal/homicidal ideations and alcohol/drug abuse.  Irritable in the ED due to not having his Opana medications, not restarted because he would not give a urine sample and he has a past history of alcohol and cocaine abuse along with notes in the chart of abusing his pain medications.  Alternative medications were started.   Past Psychiatric History: bipolar disorder, anxiety  Risk to Self: Suicidal Ideation: No Suicidal Intent: No Is patient at risk for suicide?: No Suicidal Plan?: No Access to Means: No What has been your use of drugs/alcohol within the last 12 months?: pt denies How many times?: 0 Other Self Harm Risks: none noted Triggers for Past Attempts: None known Intentional Self Injurious Behavior: None Risk to Others: Homicidal Ideation: No Thoughts of Harm to Others: No Current Homicidal Intent: No Current Homicidal Plan: No Access to Homicidal Means: No Identified Victim: none History of  harm to others?: No Assessment of Violence: None Noted Violent Behavior Description: none noted Does patient have access to weapons?: No Criminal Charges Pending?: No Does patient have a court date: Yes Court Date: 02/24/16 Prior Inpatient Therapy: Prior Inpatient Therapy: Yes Prior Therapy Dates: unknown Prior Therapy Facilty/Provider(s): [facility] Reason for Treatment: bipolar Prior Outpatient Therapy: Prior Outpatient Therapy: Yes Prior Therapy Dates: current Prior Therapy Facilty/Provider(s): [facility] Reason for Treatment: bipolar Does patient have an ACCT team?: No Does patient have Intensive In-House Services?  : No Does patient have [facility] services? : No Does patient have P4CC services?: No  Past Medical History:  Past Medical History:  Diagnosis Date  . Chronic back pain   . COPD (chronic obstructive pulmonary disease) ([place])   . Lung nodule     Past Surgical History:  Procedure Laterality Date  . APPENDECTOMY    . CERVICAL SPINE SURGERY     Family History:  Family History  Problem Relation Age of Onset  . Emphysema Paternal Grandfather     smoked   Family Psychiatric  History: none Social History:  History  Alcohol Use  . Yes     History  Drug Use No    Social History   Social History  . Marital status: Divorced    Spouse name: N/A  . Number of children: N/A  . Years of education: N/A   Social History Main Topics  . Smoking status: Current Every Day Smoker    Packs/day: 0.50    Years: 30.00    Types: Cigarettes  . Smokeless tobacco: Never Used  . Alcohol  use Yes  . Drug use: No  . Sexual activity: Not Asked   Other Topics Concern  . None   Social History Narrative  . None   Additional Social History:    Allergies:  No Known Allergies  Labs:  Results for orders placed or performed during the hospital encounter of 01/19/16 (from the past 48 hour(s))  Comprehensive metabolic panel     Status: Abnormal   Collection Time:  01/19/16  4:15 PM  Result Value Ref Range   Sodium 136 135 - 145 mmol/L   Potassium 3.8 3.5 - 5.1 mmol/L   Chloride 102 101 - 111 mmol/L   CO2 28 22 - 32 mmol/L   Glucose, Bld 99 65 - 99 mg/dL   BUN 12 6 - 20 mg/dL   Creatinine, Ser 0.71 0.61 - 1.24 mg/dL   Calcium 8.7 (L) 8.9 - 10.3 mg/dL   Total Protein 7.1 6.5 - 8.1 g/dL   Albumin 3.6 3.5 - 5.0 g/dL   AST 84 (H) 15 - 41 U/L   ALT 95 (H) 17 - 63 U/L   Alkaline Phosphatase 60 38 - 126 U/L   Total Bilirubin 0.5 0.3 - 1.2 mg/dL   GFR calc non Af Amer >60 >60 mL/min   GFR calc Af Amer >60 >60 mL/min    Comment: (NOTE) The eGFR has been calculated using the CKD EPI equation. This calculation has not been validated in all clinical situations. eGFR's persistently <60 mL/min signify possible Chronic Kidney Disease.    Anion gap 6 5 - 15  CBC with Differential     Status: None   Collection Time: 01/19/16  4:15 PM  Result Value Ref Range   WBC 5.6 4.0 - 10.5 K/uL   RBC 5.04 4.22 - 5.81 MIL/uL   Hemoglobin 14.9 13.0 - 17.0 g/dL   HCT 42.8 39.0 - 52.0 %   MCV 84.9 78.0 - 100.0 fL   MCH 29.6 26.0 - 34.0 pg   MCHC 34.8 30.0 - 36.0 g/dL   RDW 12.1 11.5 - 15.5 %   Platelets 159 150 - 400 K/uL   Neutrophils Relative % 40 %   Neutro Abs 2.2 1.7 - 7.7 K/uL   Lymphocytes Relative 40 %   Lymphs Abs 2.2 0.7 - 4.0 K/uL   Monocytes Relative 15 %   Monocytes Absolute 0.8 0.1 - 1.0 K/uL   Eosinophils Relative 4 %   Eosinophils Absolute 0.2 0.0 - 0.7 K/uL   Basophils Relative 1 %   Basophils Absolute 0.0 0.0 - 0.1 K/uL  Ethanol     Status: None   Collection Time: 01/19/16  4:15 PM  Result Value Ref Range   Alcohol, Ethyl (B) <5 <5 mg/dL    Comment:        LOWEST DETECTABLE LIMIT FOR SERUM ALCOHOL IS 5 mg/dL FOR MEDICAL PURPOSES ONLY     Current Facility-Administered Medications  Medication Dose Route Frequency Provider Last Rate Last Dose  . fluticasone (FLONASE) 50 MCG/ACT nasal spray 1 spray  1 spray Each Nare BID Royetta Asal, RPH   1 spray at 01/19/16 2241  . gabapentin (NEURONTIN) capsule 300 mg  300 mg Oral TID Davonna Belling, MD   300 mg at 01/19/16 2235  . tiZANidine (ZANAFLEX) tablet 4 mg  4 mg Oral BID PRN Patrecia Pour, NP   4 mg at 01/19/16 2235  . traMADol (ULTRAM) tablet 100 mg  100 mg Oral TID Patrecia Pour, NP  100 mg at 01/19/16 2236   Current Outpatient Prescriptions  Medication Sig Dispense Refill  . albuterol (PROVENTIL HFA;VENTOLIN HFA) 108 (90 Base) MCG/ACT inhaler Inhale 1 puff into the lungs 2 (two) times daily.    Marland Kitchen ALPRAZolam (XANAX) 0.5 MG tablet Take 0.5 mg by mouth 4 (four) times daily.     . fluticasone (FLONASE) 50 MCG/ACT nasal spray Place 1 spray into both nostrils 2 (two) times daily.     Marland Kitchen gabapentin (NEURONTIN) 300 MG capsule Take 300 mg by mouth 3 (three) times daily.    Marland Kitchen linaclotide (LINZESS) 72 MCG capsule Take 72 mcg by mouth daily before breakfast.    . oxymorphone (OPANA ER) 40 MG 12 hr tablet Take 40 mg by mouth 2 (two) times daily.    . mometasone-formoterol (DULERA) 200-5 MCG/ACT AERO Take 2 puffs first thing in am and then another 2 puffs about 12 hours later. (Patient not taking: Reported on 01/19/2016) 1 Inhaler 11    Musculoskeletal: Strength & Muscle Tone: within normal limits Gait & Station: unsteady Patient leans: N/A  Psychiatric Specialty Exam: Physical Exam  Constitutional: He is oriented to person, place, and time. He appears well-developed and well-nourished.  HENT:  Head: Normocephalic.  Neck: Normal range of motion.  Respiratory: Effort normal.  Musculoskeletal: Normal range of motion.  Neurological: He is alert and oriented to person, place, and time.  Skin: Skin is warm and dry.  Psychiatric: His speech is normal. Judgment and thought content normal. His affect is angry. Cognition and memory are normal.    Review of Systems  Constitutional: Negative.   HENT: Negative.   Eyes: Negative.   Respiratory: Negative.   Cardiovascular:  Negative.   Gastrointestinal: Negative.   Genitourinary: Negative.   Musculoskeletal: Negative.   Skin: Negative.   Neurological: Negative.   Endo/Heme/Allergies: Negative.   Psychiatric/Behavioral: Negative.     Blood pressure 121/78, pulse 70, temperature 97.5 F (36.4 C), temperature source Oral, resp. rate 18, height 5' 8.5" (1.74 m), weight 74.4 kg (164 lb), SpO2 94 %.Body mass index is 24.57 kg/m.  General Appearance: Casual  Eye Contact:  Good  Speech:  Normal Rate  Volume:  Normal  Mood:  Irritable  Affect:  Congruent  Thought Process:  Coherent and Descriptions of Associations: Intact  Orientation:  Full (Time, Place, and Person)  Thought Content:  WDL  Suicidal Thoughts:  No  Homicidal Thoughts:  No  Memory:  Immediate;   Good Recent;   Good Remote;   Good  Judgement:  Fair  Insight:  Fair  Psychomotor Activity:  Normal  Concentration:  Concentration: Good and Attention Span: Good  Recall:  Good  Fund of Knowledge:  Fair  Language:  Good  Akathisia:  No  Handed:  Right  AIMS (if indicated):     Assets:  Housing Leisure Time Resilience Social Support  ADL's:  Intact  Cognition:  WNL  Sleep:        Treatment Plan Summary: Daily contact with patient to assess and evaluate symptoms and progress in treatment, Medication management and Plan bipolar affective disorder, mixed, mild:  -Crisis stabilization -Medication management:  Start tramadol 100 mg TID for pain along with his medical medications except his narcotic medications due to his refusal of providing a urine sample and past substance abuse and reports of overtaking his pain medications. -Individual counseling  Disposition: No evidence of imminent risk to self or others at present.    Waylan Boga, NP 01/20/2016 8:50 AM

## 2016-01-20 NOTE — ED Notes (Signed)
Patient has been verbally abusive. He is calling staff names and talking very loud in the hallways, to the point of disruption of the unit.

## 2016-01-20 NOTE — Progress Notes (Addendum)
CSW spoke with pt's son, Jilda PandaJared. Pt's son stated that he is unable to pick up his father tonight but would be willing to tomorrow. CSW gathered more information about the group home pt was placed at and how he was placed there. Pt's son [facility] after pt did not have anywhere to live. Pt's son is currently living with his grandparents and is unable to help.   Pt's son contacted [facility] on 8/16 to discuss if pt would be returning and facility stated he could return if pt was on the correct medications.  Pt's son mentioned his father going to a homeless shelter once discharged and pt's son would prefer for him to discharge in the morning so he can reserve a bed at a shelter.   [facility].  Stacy GardnerErin Sidi Dzikowski, LCSWA Clinical Social Worker [phone]

## 2016-01-20 NOTE — Progress Notes (Signed)
[facility], [phone]. [facility] stated that patient would be able to return to their facility if patient is taking a bipolar injection. Corrie DandyMary stated that many of their residents take are on injections and are able to follow up at clinics to receive them monthly. CSW told Corrie DandyMary that she was unsure if this was possible and would follow up.   Stacy GardnerErin Clois Montavon, LCSWA Clinical Social Worker [phone]

## 2016-01-20 NOTE — ED Notes (Signed)
Call from Social Work, patients son has agreed to come pick him up this evening.  He requested to talk to his father.  Patient was brought to the phone.  Patient is currently yelling and cussing at his son.

## 2016-01-20 NOTE — ED Notes (Signed)
Patient was yelling and berating the Clinical research associatewriter and 2nd tech calling us fat bitches. Saying he was not going to get up and go to the bathroom unless he uses the urinal and get his opana.. He said we act like he is here because he is a drug addict and was crazy. I had explained to him that he was here for us to help him with his alcohol/ drug use and we can help him he started threaten the writer saying he is here for his back and the doctor needs to fucking help him. He then proceeded to jump out of the bed very quickly and walked to thee bathroom

## 2016-01-20 NOTE — ED Notes (Signed)
Patient once again went to restroom and refuse to give urine sample as he continue to verbally abuse staff.

## 2016-01-20 NOTE — ED Notes (Signed)
Patient is agitated and verbally aggressive towards staff at this time. However, patient does deny SI, HI and AVH at this time. Encouragement and support provided and safety maintain. Patient has one to one sitter with him. Q 15 min safety checks remain in place and video monitoring.

## 2016-01-20 NOTE — BH Assessment (Signed)
[facility] Assessment Progress Note  Per Nelly RoutArchana Kumar, MD, this pt does not require psychiatric hospitalization at this time.  He presents under IVC, which Dr Lucianne MussKumar has rescinded.  Pt is to be discharged with [facility].  Pt's nurse, Dawnaly, has been notified.  Doylene Canninghomas Sherill Wegener, MA Triage Specialist [phone]

## 2016-01-20 NOTE — ED Notes (Signed)
Patient aggressive and yelling and screaming at staff. Staff offered to assist patient to bathroom and he refuse to get up. Patient yelling: "I not walking until I get my Opana and fuck you lady in the white". Tori, [facility] informed of patient action and that patient would not be given a urinal because he has threatened to urinate on floor. Staff continues to try to de-escalate patient and patient continues to curse at staff. Safety maintain and Q 15 min safety checks remain in place and video monitoring.

## 2016-01-20 NOTE — Progress Notes (Addendum)
CSW spoke with patient's son, Jonathan Smith and informed him that patient is psychiatrically cleared. CSW asked what time he would be able to pick up patient. He stated he does not have anywhere for patient to go and patient cannot stay with him. CSW informed him that patient cannot stay in the ED. He stated he would be here to pick up patient probably between 5:00pm and 7:00pm today. He asked to speak with patient and after speaking with the Nurse, CSW provided him with the contact number for the Nurse's station. NP, Nurse,TTS, and Asst. Director of Social Work made aware.    Jonathan Smith, LCSWA [phone] ED CSW 01/20/2016 3:29 PM

## 2016-01-20 NOTE — Progress Notes (Signed)
CSW spoke received call from Doylene Canninghomas Hughes and was notified that pt's son will not longer be picking him up. CSW discussed case with supervisor, Wandra MannanZack Brooks.   Stacy GardnerErin Iyesha Such, LCSWA Clinical Social Worker [phone]

## 2016-01-20 NOTE — Progress Notes (Signed)
01/20/16 1409:  LRT introduced self to pt.  Pt refused any activities.  Caroll RancherMarjette Chloey Ricard, LRT/CTRS

## 2016-01-20 NOTE — Progress Notes (Signed)
PT Cancellation Note  Patient Details Name: Jonathan Smith MRN: [id number] DOB: 09/02/1955   Cancelled Treatment:     Chart reviewed and spoke to nurse and MD about order and possible PT needs. At this time, I observed pt walking to from his room and to his bathroom with no apparent balance issues or walking deficits at this time. Dr. Shaune PollackLord agrees to DC ing PT order at this time. Thanks .    Jonathan Smith, Jonathan Smith 01/20/2016, 5:48 PM  Jonathan Smith, PT Pager: [phone] 01/20/2016

## 2016-01-21 ENCOUNTER — Inpatient Hospital Stay: Admission: RE | Admit: 2016-01-21 | Payer: Self-pay | Source: Ambulatory Visit

## 2016-01-21 ENCOUNTER — Telehealth: Payer: Self-pay | Admitting: Internal Medicine

## 2016-01-21 MED ORDER — CARBAMAZEPINE ER 200 MG PO TB12
200.0000 mg | ORAL_TABLET | Freq: Two times a day (BID) | ORAL | Status: DC
  Administered 2016-01-21: 200 mg via ORAL
  Filled 2016-01-21 (×2): qty 1

## 2016-01-21 MED ORDER — QUETIAPINE FUMARATE 50 MG PO TABS: 50.0000 mg | ORAL_TABLET | Freq: Every day | ORAL | 0 refills | Status: DC

## 2016-01-21 MED ORDER — QUETIAPINE FUMARATE 50 MG PO TABS: 50.0000 mg | ORAL_TABLET | Freq: Every day | ORAL | Status: DC

## 2016-01-21 MED ORDER — CARBAMAZEPINE ER 200 MG PO TB12: 200.0000 mg | ORAL_TABLET | Freq: Two times a day (BID) | ORAL | 0 refills | Status: AC

## 2016-01-21 NOTE — ED Notes (Signed)
Patient continues to be irritable, loud and intrusive most of the day.  He is delusional and tangential as well.  Spoke with patients group home worker, Jonathan Smith, who states that patient can come back there if we send them an updated FL2 form and the patient must agree to sign a contract stating that he will continue to take all of his prescribed psychiatric medications.  Patient states he will sign a contract with them if they will sign a contract with him.  FL2 has been filled out and faxed.  Patients son, Jonathan Smith, has been notified and will be here to pick patient up shortly.

## 2016-01-21 NOTE — Progress Notes (Signed)
Patient was psychiatrically cleared yesterday and awaiting discharge plans.  Jonathan Smith, PMH-NP

## 2016-01-21 NOTE — ED Notes (Signed)
Patient came to desk demanding Opana. Patient informed no orders for Opana patient then states: "fuck all of y'all sorry asses". Then patient returns to back to his room.

## 2016-01-21 NOTE — Progress Notes (Signed)
[facility].   Stacy GardnerErin Aziya Arena, LCSWA Clinical Social Worker [phone]

## 2016-01-21 NOTE — Telephone Encounter (Signed)
CT chest was cancelled for today d/t pt being in the hospital. Will forward to Dr. Sherene SiresWert so he is aware.

## 2016-01-21 NOTE — NC FL2 (Signed)
[place] MEDICAID FL2 LEVEL OF CARE SCREENING TOOL     IDENTIFICATION  Patient Name: Jonathan SmartLarry R Smith Birthdate: 08/22/1955 Sex: male Admission Date (Current Location): 01/19/2016  [facility] [place]   Facility and Address:  [facility],  [address], [place] [id number]      Provider Number: [id number]  Attending Physician Name and Address:  Provider Default, MD  Relative Name and Phone Number:       Current Level of Care: Hospital Recommended Level of Care:  (Adult Care Home) Prior Approval Number:    Date Approved/Denied:   PASRR Number:    Discharge Plan: Other (Comment) (Adult Care Home)    Current Diagnoses: Patient Active Problem List   Diagnosis Date Noted  . Bipolar affective disorder, mixed, mild (H[facility]) 01/20/2016  . Chest pain 09/30/2012  . Solitary pulmonary nodule 09/30/2012  . BACK PAIN 11/01/2006  . SYMPTOM, FREQUENCY, URINARY 09/18/2006  . ANXIETY 08/03/2006  . Cigarette smoker 08/03/2006  . COPD mixed type 08/03/2006  . BACK PAIN, LOW 08/03/2006    Orientation RESPIRATION BLADDER Height & Weight     Self, Time, Situation, Place  Normal Continent Weight: 164 lb (74.4 kg) Height:  5' 8.5" (174 cm)  BEHAVIORAL SYMPTOMS/MOOD NEUROLOGICAL BOWEL NUTRITION STATUS      Continent Diet  AMBULATORY STATUS COMMUNICATION OF NEEDS Skin   Limited Assist Verbally Normal                       Personal Care Assistance Level of Assistance  Bathing, Feeding, Dressing Bathing Assistance: Independent Feeding assistance: Independent Dressing Assistance: Independent     Functional Limitations Info  Sight, Hearing, Speech Sight Info: Adequate Hearing Info: Adequate Speech Info: Adequate    SPECIAL CARE FACTORS FREQUENCY                       Contractures      Additional Factors Info  Code Status, Allergies Code Status Info:  (Full Code) Allergies Info:   (No Known Allergies)           Current  Medications (01/21/2016):  This is the current hospital active medication list Current Facility-Administered Medications  Medication Dose Route Frequency Provider Last Rate Last Dose  . carbamazepine (TEGRETOL XR) 12 hr tablet 200 mg  200 mg Oral BID Charm RingsJamison Y Lord, NP   200 mg at 01/21/16 1156  . fluticasone (FLONASE) 50 MCG/ACT nasal spray 1 spray  1 spray Each Nare BID Adalberto ColeNikola Glogovac, RPH   1 spray at 01/21/16 1007  . gabapentin (NEURONTIN) capsule 300 mg  300 mg Oral TID Benjiman CoreNathan Pickering, MD   300 mg at 01/21/16 1007  . QUEtiapine (SEROQUEL) tablet 50 mg  50 mg Oral QHS Charm RingsJamison Y Lord, NP      . tiZANidine (ZANAFLEX) tablet 4 mg  4 mg Oral BID PRN Charm RingsJamison Y Lord, NP   4 mg at 01/20/16 2135  . traMADol (ULTRAM) tablet 100 mg  100 mg Oral TID Charm RingsJamison Y Lord, NP   100 mg at 01/21/16 1007   Current Outpatient Prescriptions  Medication Sig Dispense Refill  . albuterol (PROVENTIL HFA;VENTOLIN HFA) 108 (90 Base) MCG/ACT inhaler Inhale 1 puff into the lungs 2 (two) times daily.    Marland Kitchen. ALPRAZolam (XANAX) 0.5 MG tablet Take 0.5 mg by mouth 4 (four) times daily.     . fluticasone (FLONASE) 50 MCG/ACT nasal spray Place 1 spray into both nostrils  2 (two) times daily.     Marland Kitchen. gabapentin (NEURONTIN) 300 MG capsule Take 300 mg by mouth 3 (three) times daily.    Marland Kitchen. linaclotide (LINZESS) 72 MCG capsule Take 72 mcg by mouth daily before breakfast.    . oxymorphone (OPANA ER) 40 MG 12 hr tablet Take 40 mg by mouth 2 (two) times daily.    . carbamazepine (TEGRETOL XR) 200 MG 12 hr tablet Take 1 tablet (200 mg total) by mouth 2 (two) times daily. 60 tablet 0  . mometasone-formoterol (DULERA) 200-5 MCG/ACT AERO Take 2 puffs first thing in am and then another 2 puffs about 12 hours later. (Patient not taking: Reported on 01/19/2016) 1 Inhaler 11     Discharge Medications:   Relevant Imaging Results:  Relevant Lab Results:   Additional Information    Claudean SeveranceLaVonia M Viraat Vanpatten, LCSW

## 2016-01-21 NOTE — Progress Notes (Addendum)
CSW spoke with Nurse and patient's son, Jilda PandaJared. He stated she has done everything for patient to his knowledge. CSW discussed with him obtaining approval for a new FL2 to be completed.   CSW spoke with Camila LiMary Fox, [facility] [phone] regarding a request for new FL2. She stated the patient is obtaining new medications and she would need a new FL2 completed. Fax# [phone]  CSW was informed that the Adult Care Home owner would accept patient back if she had a new FL2. Spoke with Director of Social Work to obtain approval to complete and new FL2 and she approved. Completed new FL2 and attempted to fax twice. 2nd Shift CSW will attempt to fax again.    Elenore PaddyLaVonia Kenijah Benningfield, LCSWA [phone] ED CSW 01/21/2016 3:52 PM

## 2016-01-21 NOTE — Progress Notes (Signed)
01/21/16 1407:  LRT met with pt and offered activities.  Pt refused activities stating "every disc in my back is ruptured and I can't do anything.  I can't bend over, pick nothing up off the floor, I can barely use my arms".     Victorino Sparrow, LRT/CTRS
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
## 2016-01-21 NOTE — ED Notes (Signed)
Patient continues to yell at the tech and staff "fuck you all you son of bitches". Staff continues to try to de-escalate patient and he gets louder. Encouragement and support provided and safety maintain. Q 15 min safety checks remain in place and video monitoring.

## 2016-01-21 NOTE — ED Notes (Signed)
Patient discharged to group home.  All belongings returned and signed for.  Patient denies thoughts of harm to self or others.  He continues to be argumentative and denies that he has any type of mental illness.  He left the unit via wheelchair with his son.  He was escorted to the front lobby by security.

## 2016-01-21 NOTE — ED Notes (Signed)
Patient comes to desk and curses at staff intermediately.

## 2016-01-21 NOTE — ED Notes (Signed)
Patient goes to restroom comes out and calls staff "nasty son of bitches".

## 2016-01-25 ENCOUNTER — Inpatient Hospital Stay: Admission: RE | Admit: 2016-01-25 | Payer: Self-pay | Source: Ambulatory Visit

## 2016-01-28 ENCOUNTER — Ambulatory Visit (INDEPENDENT_AMBULATORY_CARE_PROVIDER_SITE_OTHER)
Admission: RE | Admit: 2016-01-28 | Discharge: 2016-01-28 | Disposition: A | Discharge status: Home / Self Care | Payer: Medicaid Other | Source: Ambulatory Visit | Attending: Internal Medicine | Admitting: Internal Medicine

## 2016-01-28 DIAGNOSIS — R911 Solitary pulmonary nodule: Secondary | ICD-10-CM | POA: Diagnosis not present

## 2016-01-29 NOTE — Progress Notes (Signed)
Spoke with pt and notified of results per Dr. Wert. Pt verbalized understanding and denied any questions. 

## 2016-02-25 ENCOUNTER — Encounter (HOSPITAL_COMMUNITY): Payer: Self-pay | Admitting: Emergency Medicine

## 2016-02-25 ENCOUNTER — Ambulatory Visit (HOSPITAL_COMMUNITY)
Admission: EM | Admit: 2016-02-25 | Discharge: 2016-02-25 | Disposition: A | Discharge status: Home / Self Care | Payer: Medicaid Other | Attending: Internal Medicine | Admitting: Internal Medicine

## 2016-02-25 ENCOUNTER — Ambulatory Visit (INDEPENDENT_AMBULATORY_CARE_PROVIDER_SITE_OTHER): Payer: Medicaid Other

## 2016-02-25 DIAGNOSIS — J441 Chronic obstructive pulmonary disease with (acute) exacerbation: Secondary | ICD-10-CM | POA: Diagnosis not present

## 2016-02-25 MED ORDER — IPRATROPIUM-ALBUTEROL 0.5-2.5 (3) MG/3ML IN SOLN
RESPIRATORY_TRACT | Status: AC
  Filled 2016-02-25: qty 3

## 2016-02-25 MED ORDER — SODIUM CHLORIDE 0.9 % IN NEBU
INHALATION_SOLUTION | RESPIRATORY_TRACT | Status: AC
  Filled 2016-02-25: qty 3

## 2016-02-25 MED ORDER — MOMETASONE FURO-FORMOTEROL FUM 200-5 MCG/ACT IN AERO: INHALATION_SPRAY | RESPIRATORY_TRACT | 2 refills | Status: DC

## 2016-02-25 MED ORDER — IPRATROPIUM-ALBUTEROL 0.5-2.5 (3) MG/3ML IN SOLN
3.0000 mL | Freq: Once | RESPIRATORY_TRACT | Status: AC
  Administered 2016-02-25: 3 mL via RESPIRATORY_TRACT

## 2016-02-25 MED ORDER — MOMETASONE FURO-FORMOTEROL FUM 200-5 MCG/ACT IN AERO: INHALATION_SPRAY | RESPIRATORY_TRACT | 2 refills | Status: AC

## 2016-02-25 MED ORDER — METHYLPREDNISOLONE ACETATE 80 MG/ML IJ SUSP
80.0000 mg | Freq: Once | INTRAMUSCULAR | Status: AC
  Administered 2016-02-25: 80 mg via INTRAMUSCULAR

## 2016-02-25 MED ORDER — PREDNISONE 20 MG PO TABS: ORAL_TABLET | ORAL | 0 refills | Status: AC

## 2016-02-25 MED ORDER — METHYLPREDNISOLONE ACETATE 80 MG/ML IJ SUSP
INTRAMUSCULAR | Status: AC
  Filled 2016-02-25: qty 1

## 2016-02-25 NOTE — ED Triage Notes (Signed)
Patient reports coughing up green phlegm.  Patient had similar symptoms on month ago.  Patient's pcp prescribed Z-pac.  Patient reports he did get better after script, but symptoms have reoccurred.

## 2016-02-25 NOTE — ED Notes (Signed)
Patient transported to X-ray 

## 2016-02-25 NOTE — ED Provider Notes (Signed)
CSN: [id number]     Arrival date & time 02/25/16  1805 History   First MD Initiated Contact with Patient 02/25/16 1841     Chief Complaint  Patient presents with  . URI   (Consider location/radiation/quality/duration/timing/severity/associated sxs/prior Treatment) HPI Jonathan Smith is a 60 y.o. male presenting to UC with hx of COPD c/o 1 month of cough and congestion.  He was seen by his PCP about 1 month ago, given Azithromycin, felt he was improving, then cough and congestion worsened over the last week with associated fever and chills.  He has not had an inhaler in over 1 month as refills have run out.  He notes he recently moved into a new apartment complex and is concerned something there is causing him to get sick over the last month.     Past Medical History:  Diagnosis Date  . Chronic back pain   . COPD (chronic obstructive pulmonary disease) (HCC)   . Lung nodule    Past Surgical History:  Procedure Laterality Date  . APPENDECTOMY    . CERVICAL SPINE SURGERY     Family History  Problem Relation Age of Onset  . Emphysema Paternal Grandfather     smoked   Social History  Substance Use Topics  . Smoking status: Current Every Day Smoker    Packs/day: 0.50    Years: 30.00    Types: Cigarettes  . Smokeless tobacco: Never Used  . Alcohol use Yes    Review of Systems  Constitutional: Positive for chills, fatigue and fever ( subjective). Negative for appetite change.  HENT: Positive for congestion, rhinorrhea, sinus pressure and sore throat.   Respiratory: Positive for cough, chest tightness, shortness of breath and wheezing.   Gastrointestinal: Negative for abdominal pain, diarrhea, nausea and vomiting.  Neurological: Negative for dizziness, light-headedness and headaches.    Allergies  Review of patient's allergies indicates no known allergies.  Home Medications   Prior to Admission medications   Medication Sig Start Date End Date Taking? Authorizing Provider   albuterol (PROVENTIL HFA;VENTOLIN HFA) 108 (90 Base) MCG/ACT inhaler Inhale 1 puff into the lungs 2 (two) times daily.    Historical Provider, MD  ALPRAZolam Prudy Feeler(XANAX) 0.5 MG tablet Take 0.5 mg by mouth 4 (four) times daily.     Historical Provider, MD  carbamazepine (TEGRETOL XR) 200 MG 12 hr tablet Take 1 tablet (200 mg total) by mouth 2 (two) times daily. 01/21/16   Charm RingsJamison Y Lord, NP  fluticasone (FLONASE) 50 MCG/ACT nasal spray Place 1 spray into both nostrils 2 (two) times daily.     Historical Provider, MD  gabapentin (NEURONTIN) 300 MG capsule Take 300 mg by mouth 3 (three) times daily.    Historical Provider, MD  linaclotide (LINZESS) 72 MCG capsule Take 72 mcg by mouth daily before breakfast.    Historical Provider, MD  mometasone-formoterol (DULERA) 200-5 MCG/ACT AERO Take 2 puffs first thing in am and then another 2 puffs about 12 hours later. 02/25/16   Junius FinnerErin O'Malley, PA-C  oxymorphone (OPANA ER) 40 MG 12 hr tablet Take 40 mg by mouth 2 (two) times daily.    Historical Provider, MD  predniSONE (DELTASONE) 20 MG tablet 3 tabs po day one, then 2 po daily x 4 days 02/25/16   Junius FinnerErin O'Malley, PA-C   Meds Ordered and Administered this Visit   Medications  methylPREDNISolone acetate (DEPO-MEDROL) injection 80 mg (80 mg Intramuscular Given 02/25/16 1859)  ipratropium-albuterol (DUONEB) 0.5-2.5 (3) MG/3ML nebulizer solution 3 mL (  3 mLs Nebulization Given 02/25/16 1905)    BP 95/65 (BP Location: Left Arm)   Pulse 64   Temp 99.5 F (37.5 C) (Oral)   Resp 16   SpO2 96%  No data found.   Physical Exam  Constitutional: He appears well-developed and well-nourished. No distress.  Pt sitting in exam room, cane by his side. NAD  HENT:  Head: Normocephalic and atraumatic.  Right Ear: Tympanic membrane normal.  Left Ear: Tympanic membrane normal.  Nose: Nose normal.  Mouth/Throat: Uvula is midline, oropharynx is clear and moist and mucous membranes are normal.  Eyes: Conjunctivae are normal. No  scleral icterus.  Neck: Normal range of motion. Neck supple.  Cardiovascular: Normal rate, regular rhythm and normal heart sounds.   Pulmonary/Chest: Effort normal. No respiratory distress. He has wheezes. He has rhonchi. He has rales.  Coarse breath sounds throughout, worse in lower lung fields bilaterally. No respiratory distress, able to speak in full sentences. No accessory muscle use. Occasional productive cough.  Abdominal: Soft. He exhibits no distension. There is no tenderness.  Musculoskeletal: Normal range of motion.  Neurological: He is alert.  Skin: Skin is warm and dry. He is not diaphoretic.  Nursing note and vitals reviewed.   Urgent Care Course   Clinical Course    Procedures (including critical care time)  Labs Review Labs Reviewed - No data to display  Imaging Review Dg Chest 2 View  Result Date: 02/25/2016 CLINICAL DATA:  Acute onset of productive cough.  Initial encounter. EXAM: CHEST  2 VIEW COMPARISON:  CT of the chest performed 01/28/2016, and chest radiograph performed 05/01/2015 FINDINGS: The lungs are well-aerated. Mild peribronchial thickening is noted. There is no evidence of focal opacification, pleural effusion or pneumothorax. An 8 mm nodule is again noted at the left lung base, stable from prior studies. The heart is normal in size; the mediastinal contour is within normal limits. No acute osseous abnormalities are seen. Thoracic spinal fusion hardware is noted at the lower thoracic spine. IMPRESSION: Mild peribronchial thickening noted.  Lungs otherwise grossly clear. Electronically Signed   By: Roanna Raider M.D.   On: 02/25/2016 19:31     MDM   1. COPD exacerbation (HCC)    Pt presenting with COPD exacerbation. Recently completed azithromycin but cough came back.  CXR: mild peribronchial thickening, lungs otherwise clear.    Depo-medrol and duoneb given in UC. Pt states he does feel better after treatment. Lung sounds still coarse but  improved.  Rx: Dulera refilled as he states it works better than albuterol Encouraged to f/u with PCP next week. Discussed symptoms that warrant emergent care in the ED.  Patient verbalized understanding and agreement with treatment plan.      Junius Finner, PA-C 02/25/16 1948

## 2016-03-08 NOTE — Telephone Encounter (Signed)
CT done 01/28/16 reviewed, nothing further needed

## 2016-03-16 ENCOUNTER — Emergency Department (HOSPITAL_COMMUNITY): Payer: Medicaid Other

## 2016-03-16 ENCOUNTER — Emergency Department (HOSPITAL_COMMUNITY)
Admission: EM | Admit: 2016-03-16 | Discharge: 2016-03-17 | Disposition: A | Discharge status: Home / Self Care | Payer: Medicaid Other | Attending: Emergency Medicine | Admitting: Emergency Medicine

## 2016-03-16 ENCOUNTER — Encounter (HOSPITAL_COMMUNITY): Payer: Self-pay

## 2016-03-16 DIAGNOSIS — Y92240 Courthouse as the place of occurrence of the external cause: Secondary | ICD-10-CM | POA: Insufficient documentation

## 2016-03-16 DIAGNOSIS — Z5321 Procedure and treatment not carried out due to patient leaving prior to being seen by health care provider: Secondary | ICD-10-CM | POA: Insufficient documentation

## 2016-03-16 DIAGNOSIS — Y939 Activity, unspecified: Secondary | ICD-10-CM | POA: Diagnosis not present

## 2016-03-16 DIAGNOSIS — S0031XA Abrasion of nose, initial encounter: Secondary | ICD-10-CM | POA: Insufficient documentation

## 2016-03-16 DIAGNOSIS — F17210 Nicotine dependence, cigarettes, uncomplicated: Secondary | ICD-10-CM | POA: Diagnosis not present

## 2016-03-16 DIAGNOSIS — J449 Chronic obstructive pulmonary disease, unspecified: Secondary | ICD-10-CM | POA: Diagnosis not present

## 2016-03-16 DIAGNOSIS — Y999 Unspecified external cause status: Secondary | ICD-10-CM | POA: Diagnosis not present

## 2016-03-16 DIAGNOSIS — S0990XA Unspecified injury of head, initial encounter: Secondary | ICD-10-CM | POA: Insufficient documentation

## 2016-03-16 DIAGNOSIS — S0081XA Abrasion of other part of head, initial encounter: Secondary | ICD-10-CM | POA: Diagnosis not present

## 2016-03-16 DIAGNOSIS — S0992XA Unspecified injury of nose, initial encounter: Secondary | ICD-10-CM | POA: Diagnosis present

## 2016-03-16 NOTE — ED Triage Notes (Signed)
No answer x2 
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
## 2016-03-17 ENCOUNTER — Encounter (HOSPITAL_COMMUNITY): Payer: Self-pay | Admitting: Emergency Medicine

## 2016-03-17 ENCOUNTER — Emergency Department (HOSPITAL_COMMUNITY)
Admission: EM | Admit: 2016-03-17 | Discharge: 2016-03-17 | Disposition: A | Discharge status: Home / Self Care | Payer: Medicaid Other | Source: Home / Self Care | Attending: Emergency Medicine | Admitting: Emergency Medicine

## 2016-03-17 DIAGNOSIS — F17210 Nicotine dependence, cigarettes, uncomplicated: Secondary | ICD-10-CM | POA: Insufficient documentation

## 2016-03-17 DIAGNOSIS — T07XXXA Unspecified multiple injuries, initial encounter: Secondary | ICD-10-CM

## 2016-03-17 DIAGNOSIS — S0031XA Abrasion of nose, initial encounter: Secondary | ICD-10-CM

## 2016-03-17 DIAGNOSIS — S0003XA Contusion of scalp, initial encounter: Secondary | ICD-10-CM | POA: Insufficient documentation

## 2016-03-17 DIAGNOSIS — Y939 Activity, unspecified: Secondary | ICD-10-CM

## 2016-03-17 DIAGNOSIS — Y929 Unspecified place or not applicable: Secondary | ICD-10-CM | POA: Insufficient documentation

## 2016-03-17 DIAGNOSIS — J449 Chronic obstructive pulmonary disease, unspecified: Secondary | ICD-10-CM | POA: Insufficient documentation

## 2016-03-17 DIAGNOSIS — Y999 Unspecified external cause status: Secondary | ICD-10-CM | POA: Insufficient documentation

## 2016-03-17 MED ORDER — HYDROMORPHONE HCL 1 MG/ML IJ SOLN
1.0000 mg | Freq: Once | INTRAMUSCULAR | Status: AC
  Administered 2016-03-17: 1 mg via INTRAMUSCULAR
  Filled 2016-03-17: qty 1

## 2016-03-17 MED ORDER — KETOROLAC TROMETHAMINE 60 MG/2ML IM SOLN
60.0000 mg | Freq: Once | INTRAMUSCULAR | Status: AC
  Administered 2016-03-17: 60 mg via INTRAMUSCULAR
  Filled 2016-03-17: qty 2
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
## 2016-03-17 NOTE — ED Notes (Signed)
Pt called several times with no answer 
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
## 2016-03-17 NOTE — Discharge Instructions (Signed)
Your CT scans do not show any concerning traumatic injury to your skull or brain. He may continue with her daily medications. Take 600 mg ibuprofen every 6 hours for persistent pain. Follow-up with your primary care doctor in 1 week. It is possible that he may have a concussion. We advised you avoid strenuous activity or heavy lifting for one week. You may return to the emergency department for new or concerning symptoms.
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
## 2016-03-20 ENCOUNTER — Emergency Department (HOSPITAL_COMMUNITY)
Admission: EM | Admit: 2016-03-20 | Discharge: 2016-03-20 | Disposition: A | Discharge status: Home / Self Care | Payer: Medicaid Other | Attending: Dermatology | Admitting: Dermatology

## 2016-03-20 ENCOUNTER — Encounter (HOSPITAL_COMMUNITY): Payer: Self-pay | Admitting: Emergency Medicine

## 2016-03-20 DIAGNOSIS — R51 Headache: Secondary | ICD-10-CM | POA: Insufficient documentation

## 2016-03-20 DIAGNOSIS — J449 Chronic obstructive pulmonary disease, unspecified: Secondary | ICD-10-CM | POA: Insufficient documentation

## 2016-03-20 DIAGNOSIS — F17210 Nicotine dependence, cigarettes, uncomplicated: Secondary | ICD-10-CM | POA: Insufficient documentation

## 2016-03-20 DIAGNOSIS — Z79899 Other long term (current) drug therapy: Secondary | ICD-10-CM | POA: Insufficient documentation

## 2016-03-20 DIAGNOSIS — Z5321 Procedure and treatment not carried out due to patient leaving prior to being seen by health care provider: Secondary | ICD-10-CM | POA: Insufficient documentation

## 2016-03-20 NOTE — ED Notes (Signed)
Pt stated he was leaving because his prescription is ready at the drug store and he doesn't need to stay.Pt stated he was here for Oxy refill only.
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
## 2016-04-06 ENCOUNTER — Other Ambulatory Visit: Payer: Self-pay | Admitting: Internal Medicine

## 2016-05-27 ENCOUNTER — Emergency Department (HOSPITAL_COMMUNITY)
Admission: EM | Admit: 2016-05-27 | Discharge: 2016-05-27 | Disposition: A | Discharge status: Home / Self Care | Payer: Medicaid Other | Attending: Emergency Medicine | Admitting: Emergency Medicine

## 2016-05-27 ENCOUNTER — Emergency Department (HOSPITAL_COMMUNITY): Payer: Medicaid Other

## 2016-05-27 DIAGNOSIS — X501XXA Overexertion from prolonged static or awkward postures, initial encounter: Secondary | ICD-10-CM | POA: Insufficient documentation

## 2016-05-27 DIAGNOSIS — S99911A Unspecified injury of right ankle, initial encounter: Secondary | ICD-10-CM | POA: Diagnosis present

## 2016-05-27 DIAGNOSIS — M25571 Pain in right ankle and joints of right foot: Secondary | ICD-10-CM

## 2016-05-27 DIAGNOSIS — M79671 Pain in right foot: Secondary | ICD-10-CM

## 2016-05-27 DIAGNOSIS — F17210 Nicotine dependence, cigarettes, uncomplicated: Secondary | ICD-10-CM | POA: Insufficient documentation

## 2016-05-27 DIAGNOSIS — Y939 Activity, unspecified: Secondary | ICD-10-CM | POA: Diagnosis not present

## 2016-05-27 DIAGNOSIS — Y999 Unspecified external cause status: Secondary | ICD-10-CM | POA: Diagnosis not present

## 2016-05-27 DIAGNOSIS — Z79899 Other long term (current) drug therapy: Secondary | ICD-10-CM | POA: Diagnosis not present

## 2016-05-27 DIAGNOSIS — J449 Chronic obstructive pulmonary disease, unspecified: Secondary | ICD-10-CM | POA: Insufficient documentation

## 2016-05-27 DIAGNOSIS — Y929 Unspecified place or not applicable: Secondary | ICD-10-CM | POA: Insufficient documentation

## 2016-05-27 MED ORDER — IBUPROFEN 800 MG PO TABS: 800.0000 mg | ORAL_TABLET | Freq: Three times a day (TID) | ORAL | 0 refills | Status: AC

## 2016-05-27 MED ORDER — IBUPROFEN 800 MG PO TABS
800.0000 mg | ORAL_TABLET | Freq: Once | ORAL | Status: AC
  Administered 2016-05-27: 800 mg via ORAL
  Filled 2016-05-27: qty 1

## 2016-05-27 NOTE — ED Notes (Signed)
Patient was alert, oriented and stable upon discharge. RN went over AVS and patient had no further questions.  
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
## 2016-05-27 NOTE — Discharge Instructions (Signed)
Medications: Ibuprofen  Treatment: Take ibuprofen 3 times daily as needed for your pain. Use ice 3-4 times daily alternating 20 minutes on, 20 minutes off. Keep your foot elevated when you're not ambulating on it. Use crutches to ambulate carefully. Begin to partially weight-bear with crutches as tolerated.  Follow-up: Please follow-up with Dr. Jerl Santosalldorf, an orthopedic doctor, for further evaluation and treatment of your pain if it is not improving in 10-14 days. Please return to the emergency department if you develop any new or worsening symptoms.
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
## 2016-05-27 NOTE — ED Triage Notes (Signed)
Pt states that he twisted his R ankle today when he was walking. Alert and oriented.

## 2016-06-21 ENCOUNTER — Telehealth: Payer: Self-pay | Admitting: Internal Medicine

## 2016-06-21 NOTE — Telephone Encounter (Signed)
lmomtcb x 1   [facility] No PET scan in the system

## 2016-06-22 ENCOUNTER — Telehealth: Payer: Self-pay | Admitting: Pulmonary Disease

## 2016-06-22 NOTE — Telephone Encounter (Signed)
Patient called notifying that he is out of [facility]. Requested phone in prescription to the [facility] on [address] [place] Completed.

## 2016-06-24 NOTE — Telephone Encounter (Signed)
Pt returning call.Jonathan Smith ° °

## 2016-06-24 NOTE — Telephone Encounter (Signed)
lmtcb x2 for pt. 

## 2016-06-24 NOTE — Telephone Encounter (Signed)
Spoke with pt. Advised him that he would need an appointment before we could refill his Dulera. Pt began to make excuses as to why he could not make an appointment. Also advised him that we do have a PET scan on file for him, states that he was referring to his CT scan. This was done back in August and these results were given to him. Advised pt that I would be happy to make him an appointment so we could refill his medication and he declined again. Nothing further was needed.

## 2016-10-31 IMAGING — CT CT CHEST W/O CM
2 of 3 series · 15 of 36 positions shown, 18 images · non-contrast
Comparison: 09/19/2012

CLINICAL DATA: Followup left lung nodule.

EXAM:
CT CHEST WITHOUT CONTRAST
TECHNIQUE: Multidetector CT imaging of the chest was performed following the
standard protocol without IV contrast.

[Series 2: thorax · axial · 0.83mm/px · z∈[+1170,+1468]mm · 12 of 177 slices shown, 15 images]
[im 14/177  mediastinal]
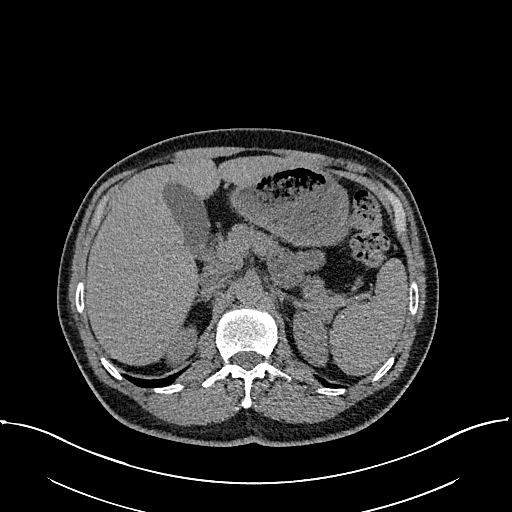
[im 14/177  lung]
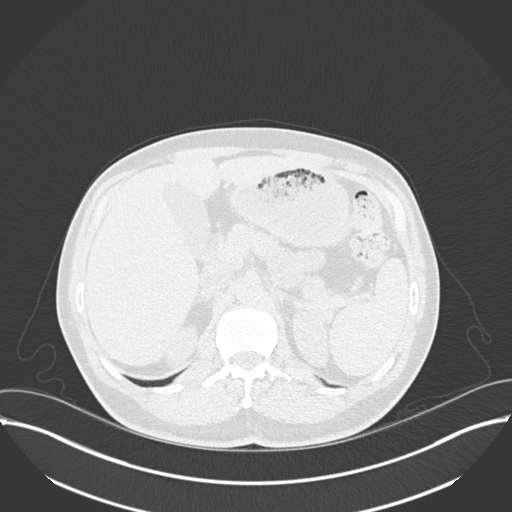
[im 27/177  lung]
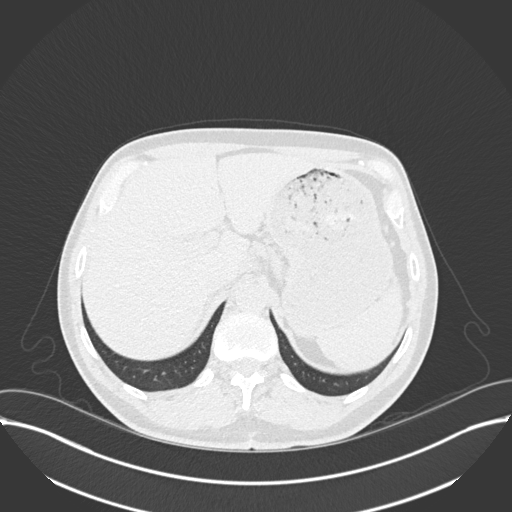
[im 40/177  lung]
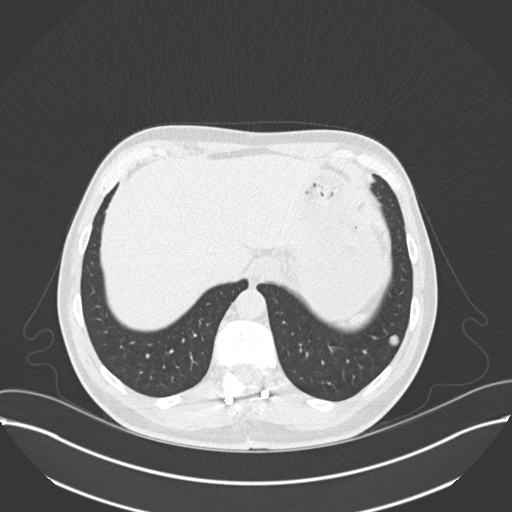
[im 53/177  lung]
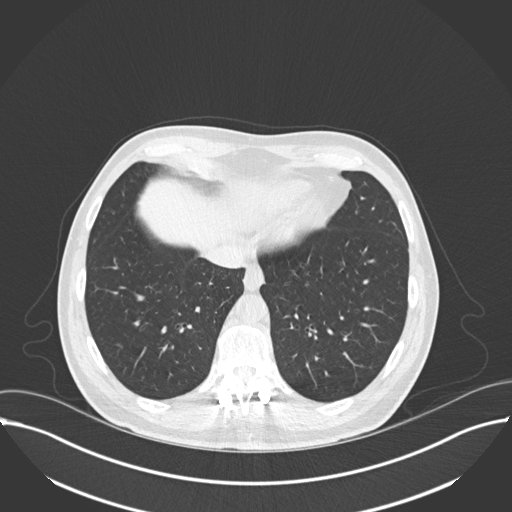
[im 66/177  mediastinal]
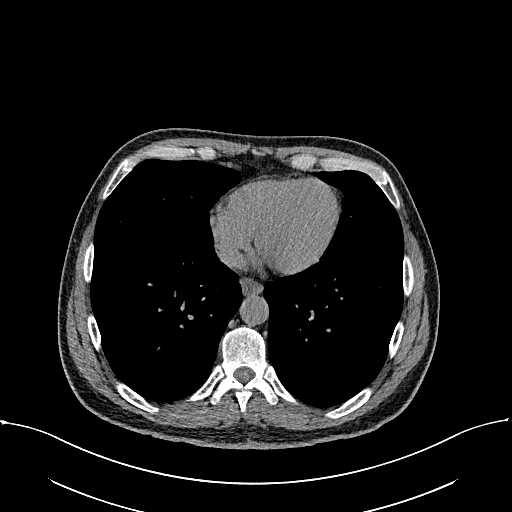
[im 66/177  lung]
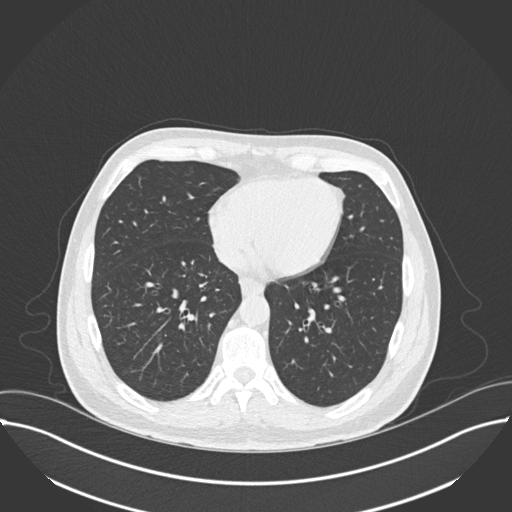
[im 79/177  lung]
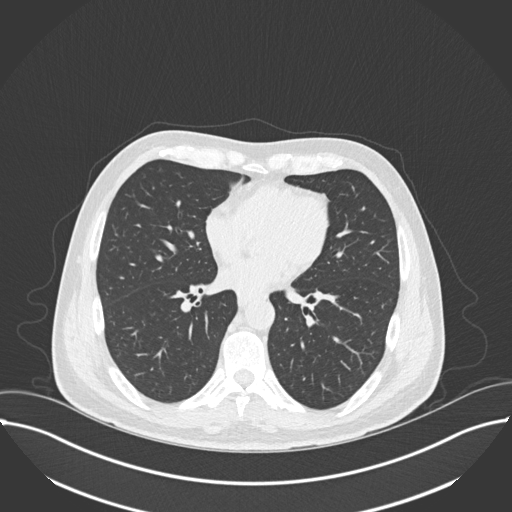
[im 98/177  lung]
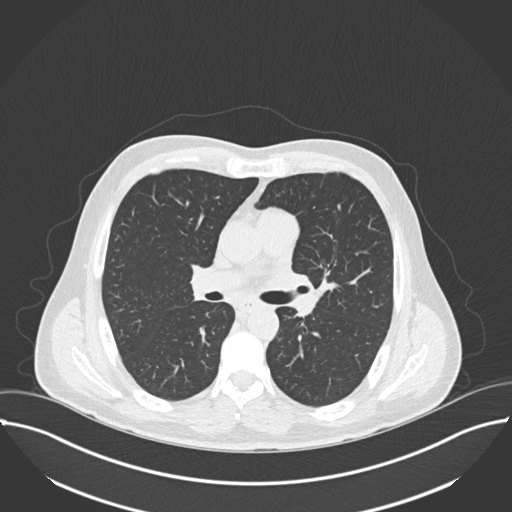
[im 111/177  lung]
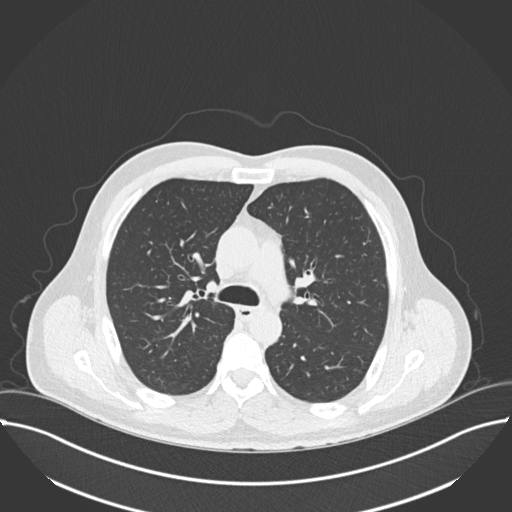
[im 124/177  mediastinal]
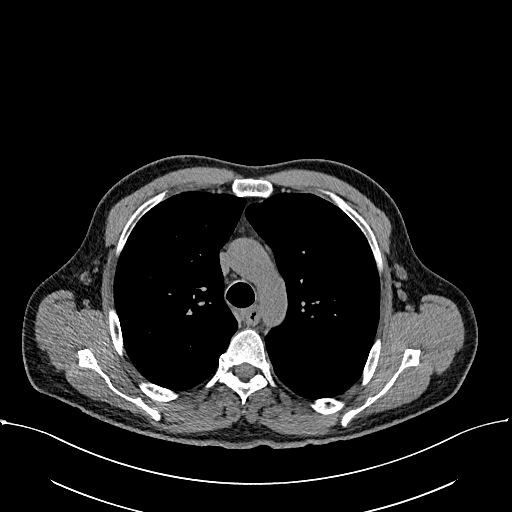
[im 124/177  lung]
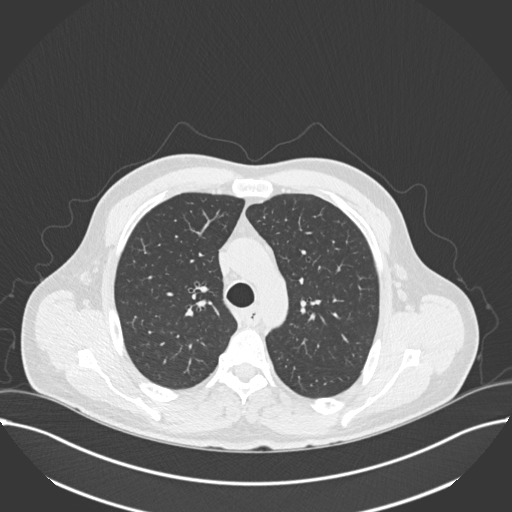
[im 137/177  lung]
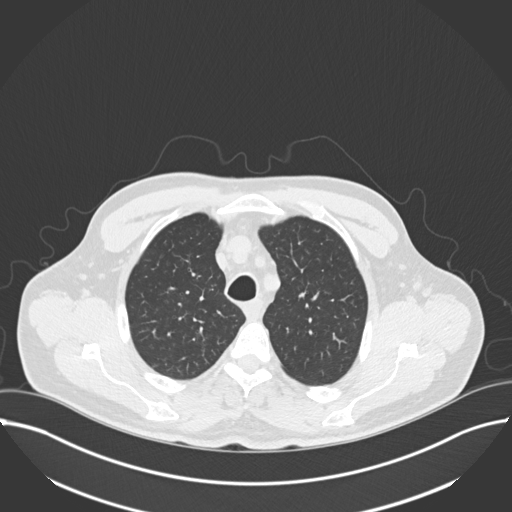
[im 150/177  lung]
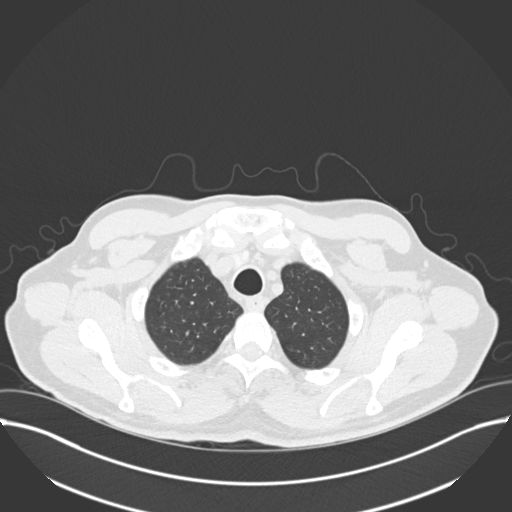
[im 163/177  lung]
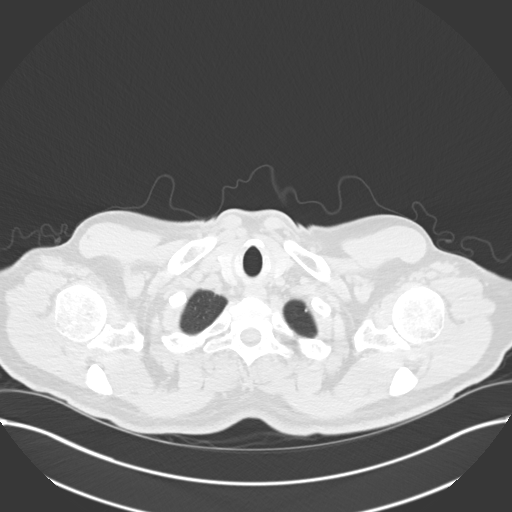

[Series 5: coronal · coronal · 0.69mm/px · 3 of 151 slices shown]
[im 31/151  lung]
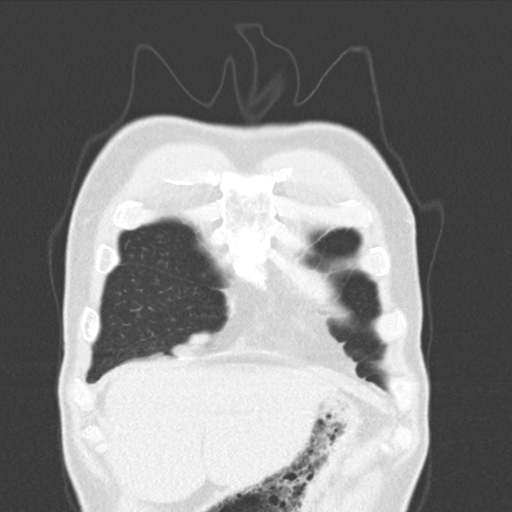
[im 61/151  lung]
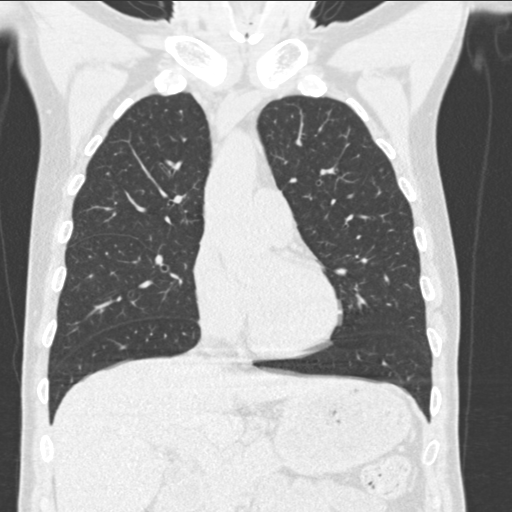
[im 91/151  lung]
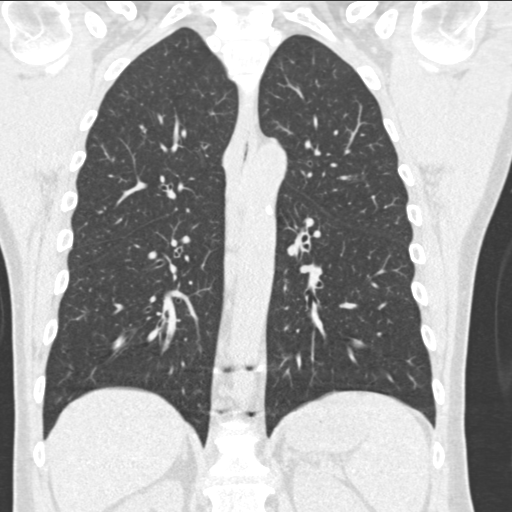

[15 of 36 positions shown; findings below may reference images not displayed]

FINDINGS: Cardiovascular: Normal heart size.  Aortic atherosclerosis.

Mediastinum/Nodes: No masses or pathologically enlarged lymph nodes
identified on this un-enhanced exam.

Lungs/Pleura: 8 mm well-circumscribed pulmonary nodule in the
posterior left lower lobe on image 138/3 remains stable, consistent
with benign etiology. No new or enlarging pulmonary nodules or
masses identified. No evidence of pulmonary infiltrate or pleural
effusion.

Upper Abdomen: No acute findings.

Musculoskeletal: No chest wall mass or suspicious bone lesions
identified. Lower cervical and lower thoracic spine fusion hardware
noted.
IMPRESSION: Stable 8 mm left lower lobe pulmonary nodule, consistent with benign
etiology. No further imaging followup required. This recommendation
follows the consensus statement: Guidelines for Management of Small
Pulmonary Nodules Detected on CT Images: From the [HOSPITAL]
1431; published online before print (10.1148/radiol.7920292930).

Aortic atherosclerosis.

## 2016-12-04 DEATH — deceased
